# Patient Record
Sex: Female | Born: 1965 | Race: White | Hispanic: No | Smoking: Never smoker
Health system: Southern US, Community
[De-identification: ages and names within clinical notes are randomized; demographics above are authoritative.]

## PROBLEM LIST (undated history)

## (undated) DIAGNOSIS — Z803 Family history of malignant neoplasm of breast: Secondary | ICD-10-CM

## (undated) DIAGNOSIS — E785 Hyperlipidemia, unspecified: Secondary | ICD-10-CM

## (undated) DIAGNOSIS — Z801 Family history of malignant neoplasm of trachea, bronchus and lung: Secondary | ICD-10-CM

## (undated) DIAGNOSIS — S42302A Unspecified fracture of shaft of humerus, left arm, initial encounter for closed fracture: Secondary | ICD-10-CM

## (undated) DIAGNOSIS — Z8481 Family history of carrier of genetic disease: Secondary | ICD-10-CM

## (undated) DIAGNOSIS — E119 Type 2 diabetes mellitus without complications: Secondary | ICD-10-CM

## (undated) DIAGNOSIS — K069 Disorder of gingiva and edentulous alveolar ridge, unspecified: Secondary | ICD-10-CM

## (undated) DIAGNOSIS — M259 Joint disorder, unspecified: Secondary | ICD-10-CM

## (undated) DIAGNOSIS — E559 Vitamin D deficiency, unspecified: Secondary | ICD-10-CM

## (undated) DIAGNOSIS — G709 Myoneural disorder, unspecified: Secondary | ICD-10-CM

## (undated) DIAGNOSIS — D649 Anemia, unspecified: Secondary | ICD-10-CM

## (undated) DIAGNOSIS — Z800 Family history of malignant neoplasm of digestive organs: Secondary | ICD-10-CM

## (undated) DIAGNOSIS — M7500 Adhesive capsulitis of unspecified shoulder: Secondary | ICD-10-CM

## (undated) HISTORY — DX: Family history of carrier of genetic disease: Z84.81

## (undated) HISTORY — DX: Type 2 diabetes mellitus without complications: E11.9

## (undated) HISTORY — DX: Unspecified fracture of shaft of humerus, left arm, initial encounter for closed fracture: S42.302A

## (undated) HISTORY — DX: Family history of malignant neoplasm of breast: Z80.3

## (undated) HISTORY — PX: FRACTURE SURGERY: SHX138

## (undated) HISTORY — DX: Family history of malignant neoplasm of trachea, bronchus and lung: Z80.1

## (undated) HISTORY — DX: Vitamin D deficiency, unspecified: E55.9

## (undated) HISTORY — DX: Disorder of gingiva and edentulous alveolar ridge, unspecified: K06.9

## (undated) HISTORY — DX: Family history of malignant neoplasm of digestive organs: Z80.0

## (undated) HISTORY — DX: Hyperlipidemia, unspecified: E78.5

## (undated) HISTORY — DX: Adhesive capsulitis of unspecified shoulder: M75.00

---
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
## 1996-06-04 HISTORY — PX: CHOLECYSTECTOMY: SHX55

## 2009-10-18 ENCOUNTER — Ambulatory Visit: Payer: Self-pay | Admitting: Internal Medicine

## 2010-06-13 ENCOUNTER — Other Ambulatory Visit: Payer: Self-pay | Admitting: Internal Medicine

## 2010-06-19 ENCOUNTER — Ambulatory Visit: Payer: Self-pay | Admitting: Internal Medicine

## 2010-06-27 ENCOUNTER — Ambulatory Visit: Payer: Self-pay | Admitting: Internal Medicine

## 2010-07-05 ENCOUNTER — Ambulatory Visit: Payer: Self-pay | Admitting: Internal Medicine

## 2010-08-03 ENCOUNTER — Ambulatory Visit: Payer: Self-pay | Admitting: Internal Medicine

## 2010-09-03 ENCOUNTER — Ambulatory Visit: Payer: Self-pay | Admitting: Internal Medicine

## 2011-06-24 ENCOUNTER — Ambulatory Visit: Payer: Self-pay

## 2012-12-28 LAB — HM DIABETES EYE EXAM

## 2012-12-28 LAB — HM PAP SMEAR: HM Pap smear: NORMAL

## 2012-12-28 LAB — HM MAMMOGRAPHY: HM Mammogram: NORMAL

## 2014-06-04 DIAGNOSIS — M7500 Adhesive capsulitis of unspecified shoulder: Secondary | ICD-10-CM

## 2014-06-04 DIAGNOSIS — G709 Myoneural disorder, unspecified: Secondary | ICD-10-CM

## 2014-06-04 HISTORY — DX: Myoneural disorder, unspecified: G70.9

## 2014-06-04 HISTORY — DX: Adhesive capsulitis of unspecified shoulder: M75.00

## 2014-06-28 ENCOUNTER — Ambulatory Visit: Payer: Self-pay | Admitting: Physician Assistant

## 2014-07-05 HISTORY — PX: EXTERNAL FIXATION ARM: SHX1552

## 2014-07-23 DIAGNOSIS — S42409A Unspecified fracture of lower end of unspecified humerus, initial encounter for closed fracture: Secondary | ICD-10-CM

## 2014-07-23 HISTORY — DX: Unspecified fracture of lower end of unspecified humerus, initial encounter for closed fracture: S42.409A

## 2014-11-26 ENCOUNTER — Ambulatory Visit: Payer: Self-pay | Admitting: Internal Medicine

## 2014-12-29 ENCOUNTER — Encounter (INDEPENDENT_AMBULATORY_CARE_PROVIDER_SITE_OTHER): Payer: Self-pay

## 2014-12-29 ENCOUNTER — Ambulatory Visit (INDEPENDENT_AMBULATORY_CARE_PROVIDER_SITE_OTHER): Payer: Federal, State, Local not specified - PPO | Admitting: Internal Medicine

## 2014-12-29 ENCOUNTER — Encounter: Payer: Self-pay | Admitting: Internal Medicine

## 2014-12-29 VITALS — BP 137/86 | HR 78 | Temp 98.1°F | Ht 63.5 in | Wt 167.5 lb

## 2014-12-29 DIAGNOSIS — E119 Type 2 diabetes mellitus without complications: Secondary | ICD-10-CM | POA: Diagnosis not present

## 2014-12-29 DIAGNOSIS — G894 Chronic pain syndrome: Secondary | ICD-10-CM

## 2014-12-29 DIAGNOSIS — Z1239 Encounter for other screening for malignant neoplasm of breast: Secondary | ICD-10-CM | POA: Diagnosis not present

## 2014-12-29 DIAGNOSIS — Z0000 Encounter for general adult medical examination without abnormal findings: Secondary | ICD-10-CM | POA: Diagnosis not present

## 2014-12-29 DIAGNOSIS — R7989 Other specified abnormal findings of blood chemistry: Secondary | ICD-10-CM | POA: Diagnosis not present

## 2014-12-29 LAB — COMPREHENSIVE METABOLIC PANEL
ALK PHOS: 89 U/L (ref 39–117)
ALT: 28 U/L (ref 0–35)
AST: 20 U/L (ref 0–37)
Albumin: 4.2 g/dL (ref 3.5–5.2)
BUN: 7 mg/dL (ref 6–23)
CO2: 24 meq/L (ref 19–32)
CREATININE: 0.67 mg/dL (ref 0.40–1.20)
Calcium: 9.4 mg/dL (ref 8.4–10.5)
Chloride: 98 mEq/L (ref 96–112)
GFR: 100.27 mL/min (ref >60.00)
Glucose, Bld: 346 mg/dL — ABNORMAL HIGH (ref 70–99)
Potassium: 4 mEq/L (ref 3.5–5.1)
Sodium: 133 mEq/L — ABNORMAL LOW (ref 135–145)
Total Bilirubin: 0.5 mg/dL (ref 0.2–1.2)
Total Protein: 7.5 g/dL (ref 6.0–8.3)

## 2014-12-29 LAB — LIPID PANEL
Cholesterol: 296 mg/dL — ABNORMAL HIGH (ref 0–200)
HDL: 47.4 mg/dL (ref >39.00)
NONHDL: 248.6
TRIGLYCERIDES: 251 mg/dL — AB (ref 0.0–149.0)
Total CHOL/HDL Ratio: 6
VLDL: 50.2 mg/dL — ABNORMAL HIGH (ref 0.0–40.0)

## 2014-12-29 LAB — CBC WITH DIFFERENTIAL/PLATELET
Basophils Absolute: 0 10*3/uL (ref 0.0–0.1)
Basophils Relative: 0.5 % (ref 0.0–3.0)
EOS ABS: 0 10*3/uL (ref 0.0–0.7)
Eosinophils Relative: 0.5 % (ref 0.0–5.0)
HEMATOCRIT: 43.4 % (ref 36.0–46.0)
HEMOGLOBIN: 14.9 g/dL (ref 12.0–15.0)
Lymphocytes Relative: 36.8 % (ref 12.0–46.0)
Lymphs Abs: 2.8 10*3/uL (ref 0.7–4.0)
MCHC: 34.4 g/dL (ref 30.0–36.0)
MCV: 83.2 fl (ref 78.0–100.0)
Monocytes Absolute: 0.5 10*3/uL (ref 0.1–1.0)
Monocytes Relative: 7 % (ref 3.0–12.0)
NEUTROS ABS: 4.2 10*3/uL (ref 1.4–7.7)
Neutrophils Relative %: 55.2 % (ref 43.0–77.0)
PLATELETS: 259 10*3/uL (ref 150.0–400.0)
RBC: 5.21 Mil/uL — AB (ref 3.87–5.11)
RDW: 13.1 % (ref 11.5–15.5)
WBC: 7.7 10*3/uL (ref 4.0–10.5)

## 2014-12-29 LAB — LDL CHOLESTEROL, DIRECT: LDL DIRECT: 204 mg/dL

## 2014-12-29 LAB — TSH: TSH: 2.42 u[IU]/mL (ref 0.35–4.50)

## 2014-12-29 LAB — HEMOGLOBIN A1C: HEMOGLOBIN A1C: 11.6 % — AB (ref 4.6–6.5)

## 2014-12-29 LAB — MICROALBUMIN / CREATININE URINE RATIO
CREATININE, U: 72.8 mg/dL
MICROALB UR: 3 mg/dL — AB (ref 0.0–1.9)
Microalb Creat Ratio: 4.1 mg/g (ref 0.0–30.0)

## 2014-12-29 LAB — VITAMIN D 25 HYDROXY (VIT D DEFICIENCY, FRACTURES): VITD: 22.43 ng/mL — ABNORMAL LOW (ref 30.00–100.00)

## 2014-12-29 MED ORDER — GABAPENTIN 100 MG PO CAPS: 100.0000 mg | ORAL_CAPSULE | Freq: Three times a day (TID) | ORAL | Status: DC

## 2014-12-29 NOTE — Assessment & Plan Note (Signed)
Severe chronic pain after injury earlier this year. Reviewed [facility] ortho notes. Referral to pain management pending. Will start Neurontin 100mg  three times daily. Discussed potential side effects of this medication.

## 2014-12-29 NOTE — Assessment & Plan Note (Signed)
BG elevated. Will check A1c with labs. Discussed that we will need to add medication, but will make final decision based on labs today. Glucometer given today. Encouraged her to check BG 1-2 times daily at minimum.

## 2014-12-29 NOTE — Patient Instructions (Signed)
Start Neurontin 100mg  three times daily to help with pain.  Labs today.  Follow up in 2 weeks.

## 2014-12-29 NOTE — Progress Notes (Signed)
Pre visit review using our clinic review tool, if applicable. No additional management support is needed unless otherwise documented below in the visit note. 

## 2014-12-29 NOTE — Progress Notes (Signed)
Subjective:    Patient ID: Paula Mcmahon, female    DOB: 1965/06/14, 49 y.o.   MRN: [id number]  HPI  49YO female presents to establish care.  DM - BG have been near 200-400s. Not on any medication for this.  Notes some polyphagia.  Accident 06/28/2014, slipped on ice. Had multiple left arm fractures requiring surgical repair. 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  Chronic pain in left arm after this accident - Having burning pain in left hand. Unbearable at times. Continues with PT and OT at [facility]. Referral to pain management is pending.  Past medical, surgical, family and social history per today's encounter.  Review of Systems  Constitutional: Negative for fever, chills, appetite change, fatigue and unexpected weight change.  Eyes: Negative for visual disturbance.  Respiratory: Negative for shortness of breath.   Cardiovascular: Negative for chest pain, palpitations and leg swelling.  Gastrointestinal: Negative for nausea, vomiting, abdominal pain, diarrhea and constipation.  Musculoskeletal: Positive for myalgias and arthralgias.  Skin: Negative for color change and rash.  Neurological: Positive for weakness. Negative for numbness.  Hematological: Negative for adenopathy. Does not bruise/bleed easily.  Psychiatric/Behavioral: Positive for sleep disturbance. Negative for suicidal ideas and dysphoric mood. The patient is not nervous/anxious.        Objective:    BP 137/86 mmHg  Pulse 78  Temp(Src) 98.1 F (36.7 C) (Oral)  Ht 5' 3.5" (1.613 m)  Wt 167 lb 8 oz (75.978 kg)  BMI 29.20 kg/m2  SpO2 100%  LMP 12/20/2014 Physical Exam  Constitutional: She is oriented to person, place, and time. She appears well-developed and well-nourished. No distress.  HENT:  Head: Normocephalic and atraumatic.  Right Ear: External ear normal.  Left Ear: External ear normal.  Nose: Nose normal.  Mouth/Throat: Oropharynx is clear and moist. No oropharyngeal exudate.  Eyes: Conjunctivae and EOM  are normal. Pupils are equal, round, and reactive to light. Right eye exhibits no discharge. Left eye exhibits no discharge. No scleral icterus.  Neck: Normal range of motion. Neck supple. No tracheal deviation present. No thyromegaly present.  Cardiovascular: Normal rate, regular rhythm, normal heart sounds and intact distal pulses.  Exam reveals no gallop and no friction rub.   No murmur heard. Pulmonary/Chest: Effort normal and breath sounds normal. No respiratory distress. She has no wheezes. She has no rales. She exhibits no tenderness.  Abdominal: Soft. Bowel sounds are normal. She exhibits no distension and no mass. There is no tenderness. There is no rebound and no guarding.  Musculoskeletal: Normal range of motion. She exhibits no edema or tenderness.  Lymphadenopathy:    She has no cervical adenopathy.  Neurological: She is alert and oriented to person, place, and time. No cranial nerve deficit. She exhibits normal muscle tone. Coordination normal.  Skin: Skin is warm and dry. No rash noted. She is not diaphoretic. No erythema. No pallor.  Psychiatric: She has a normal mood and affect. Her behavior is normal. Judgment and thought content normal.          Assessment & Plan:   Problem List Items Addressed This Visit      Unprioritized   Chronic pain syndrome    Severe chronic pain after injury earlier this year. Reviewed [facility] ortho notes. Referral to pain management pending. Will start Neurontin 100mg  three times daily. Discussed potential side effects of this medication.      Relevant Medications   gabapentin (NEURONTIN) 100 MG capsule   Diabetes type 2, controlled - Primary  BG elevated. Will check A1c with labs. Discussed that we will need to add medication, but will make final decision based on labs today. Glucometer given today. Encouraged her to check BG 1-2 times daily at minimum.      Relevant Orders   Comprehensive metabolic panel   Hemoglobin A1c   Routine  general medical examination at a health care facility    Will check labs today. Plan for physical exam later this summer      Relevant Orders   CBC with Differential/Platelet   TSH   Microalbumin / creatinine urine ratio   Lipid panel   Vit D  25 hydroxy (rtn osteoporosis monitoring)   Screening for breast cancer    Mammogram ordered.      Relevant Orders   MM Digital Screening       Return in about 2 weeks (around 01/12/2015) for Recheck of Diabetes.

## 2014-12-29 NOTE — Assessment & Plan Note (Signed)
Will check labs today. Plan for physical exam later this summer

## 2014-12-29 NOTE — Assessment & Plan Note (Signed)
Mammogram ordered

## 2014-12-30 ENCOUNTER — Other Ambulatory Visit: Payer: Self-pay | Admitting: *Deleted

## 2014-12-30 MED ORDER — METFORMIN HCL 500 MG PO TABS: 500.0000 mg | ORAL_TABLET | Freq: Two times a day (BID) | ORAL | Status: DC

## 2014-12-30 MED ORDER — GLIPIZIDE 5 MG PO TABS: 2.5000 mg | ORAL_TABLET | Freq: Every day | ORAL | Status: DC

## 2014-12-30 MED ORDER — ATORVASTATIN CALCIUM 10 MG PO TABS: 10.0000 mg | ORAL_TABLET | Freq: Every day | ORAL | Status: DC

## 2014-12-31 ENCOUNTER — Encounter: Payer: Self-pay | Admitting: *Deleted

## 2015-01-07 ENCOUNTER — Encounter: Payer: Self-pay | Admitting: Internal Medicine

## 2015-01-10 ENCOUNTER — Ambulatory Visit: Payer: Federal, State, Local not specified - PPO

## 2015-01-12 ENCOUNTER — Encounter: Payer: Self-pay | Admitting: Internal Medicine

## 2015-01-12 ENCOUNTER — Ambulatory Visit (INDEPENDENT_AMBULATORY_CARE_PROVIDER_SITE_OTHER): Payer: Federal, State, Local not specified - PPO | Admitting: Internal Medicine

## 2015-01-12 VITALS — BP 122/62 | HR 72 | Temp 97.4°F | Wt 167.2 lb

## 2015-01-12 DIAGNOSIS — G894 Chronic pain syndrome: Secondary | ICD-10-CM | POA: Diagnosis not present

## 2015-01-12 DIAGNOSIS — E119 Type 2 diabetes mellitus without complications: Secondary | ICD-10-CM | POA: Diagnosis not present

## 2015-01-12 DIAGNOSIS — Z23 Encounter for immunization: Secondary | ICD-10-CM | POA: Diagnosis not present

## 2015-01-12 MED ORDER — GLIPIZIDE 5 MG PO TABS: 5.0000 mg | ORAL_TABLET | Freq: Every day | ORAL | Status: DC

## 2015-01-12 MED ORDER — SITAGLIPTIN PHOSPHATE 25 MG PO TABS: 25.0000 mg | ORAL_TABLET | Freq: Every day | ORAL | Status: DC

## 2015-01-12 MED ORDER — GABAPENTIN 300 MG PO CAPS: 300.0000 mg | ORAL_CAPSULE | Freq: Three times a day (TID) | ORAL | Status: DC

## 2015-01-12 NOTE — Assessment & Plan Note (Signed)
Chronic left hand pain. Will increase neurontin to 300mg  po tid. Discussed potential risks of this medication. Follow up by email in 1-2 weeks and visit in 4 weeks. Consider adding Cymbalta if no improvement.

## 2015-01-12 NOTE — Addendum Note (Signed)
Addended byCloyd Stagers B on: 01/12/2015 07:56 AM   Modules accepted: Orders

## 2015-01-12 NOTE — Assessment & Plan Note (Signed)
BG continue to be elevated. We discussed several options for escalating medication. Will add Januvia 25mg  daily. Continue Glipizide and Metformin. Email with BG in 1-2 weeks. Follow up in 4 weeks.

## 2015-01-12 NOTE — Patient Instructions (Addendum)
Start Januvia 25mg  daily with breakfast.  Continue Metformin and Glipizide.  Email with update in 1-2 weeks.  Increase Neurontin to 300mg  three times daily

## 2015-01-12 NOTE — Progress Notes (Signed)
Subjective:    Patient ID: Paula Mcmahon, female    DOB: December 07, 1965, 49 y.o.   MRN: [id number]  HPI  49YO female presents for follow up.  Last seen 7/27 to establish care. Started on neurontin for diabetic neuropathy. A1c elevated at 11.6%. Started back on Metformin and Glipizide.  BG initially running high, mostly in 200s. Dose of Glipizide increased to 5mg  daily. BG continue to be high, mostly near 200.  Started Neurontin for chronic hand pain. Some drowsiness with this medication. Pain very slightly improved. Planning to have left shoulder steroid injection with ortho this week.  Past medical, surgical, family and social history per today's encounter.   Review of Systems  Constitutional: Negative for fever, chills, appetite change, fatigue and unexpected weight change.  Eyes: Negative for visual disturbance.  Respiratory: Negative for shortness of breath.   Cardiovascular: Negative for chest pain and leg swelling.  Gastrointestinal: Negative for nausea, vomiting, abdominal pain, diarrhea and constipation.  Musculoskeletal: Positive for myalgias and arthralgias.  Skin: Negative for color change and rash.  Neurological: Positive for weakness and numbness.  Hematological: Negative for adenopathy. Does not bruise/bleed easily.  Psychiatric/Behavioral: Negative for sleep disturbance and dysphoric mood. The patient is not nervous/anxious.        Objective:    BP 122/62 mmHg  Pulse 72  Temp(Src) 97.4 F (36.3 C) (Oral)  Wt 167 lb 3.2 oz (75.841 kg)  SpO2 98%  LMP 01/07/2015 Physical Exam  Constitutional: She is oriented to person, place, and time. She appears well-developed and well-nourished. No distress.  HENT:  Head: Normocephalic and atraumatic.  Right Ear: External ear normal.  Left Ear: External ear normal.  Nose: Nose normal.  Mouth/Throat: Oropharynx is clear and moist.  Eyes: Conjunctivae are normal. Pupils are equal, round, and reactive to light. Right eye  exhibits no discharge. Left eye exhibits no discharge. No scleral icterus.  Neck: Normal range of motion. Neck supple. No tracheal deviation present. No thyromegaly present.  Cardiovascular: Normal rate, regular rhythm, normal heart sounds and intact distal pulses.  Exam reveals no gallop and no friction rub.   No murmur heard. Pulmonary/Chest: Effort normal and breath sounds normal. No respiratory distress. She has no wheezes. She has no rales. She exhibits no tenderness.  Musculoskeletal: Normal range of motion. She exhibits no edema or tenderness.  Lymphadenopathy:    She has no cervical adenopathy.  Neurological: She is alert and oriented to person, place, and time. No cranial nerve deficit. She exhibits normal muscle tone. Coordination normal.  Skin: Skin is warm and dry. No rash noted. She is not diaphoretic. No erythema. No pallor.  Psychiatric: She has a normal mood and affect. Her behavior is normal. Judgment and thought content normal.          Assessment & Plan:   Problem List Items Addressed This Visit      Unprioritized   Chronic pain syndrome    Chronic left hand pain. Will increase neurontin to 300mg  po tid. Discussed potential risks of this medication. Follow up by email in 1-2 weeks and visit in 4 weeks. Consider adding Cymbalta if no improvement.      Relevant Medications   gabapentin (NEURONTIN) 300 MG capsule   Diabetes type 2, controlled - Primary    BG continue to be elevated. We discussed several options for escalating medication. Will add Januvia 25mg  daily. Continue Glipizide and Metformin. Email with BG in 1-2 weeks. Follow up in 4 weeks.  Relevant Medications   glipiZIDE (GLUCOTROL) 5 MG tablet   sitaGLIPtin (JANUVIA) 25 MG tablet       Return in about 4 weeks (around 02/09/2015) for Recheck of Diabetes.

## 2015-01-14 ENCOUNTER — Ambulatory Visit
Admission: RE | Admit: 2015-01-14 | Discharge: 2015-01-14 | Disposition: A | Discharge status: Home / Self Care | Payer: Federal, State, Local not specified - PPO | Source: Ambulatory Visit | Attending: Internal Medicine | Admitting: Internal Medicine

## 2015-01-14 DIAGNOSIS — Z1239 Encounter for other screening for malignant neoplasm of breast: Secondary | ICD-10-CM

## 2015-01-14 DIAGNOSIS — Z1231 Encounter for screening mammogram for malignant neoplasm of breast: Secondary | ICD-10-CM | POA: Insufficient documentation

## 2015-01-14 DIAGNOSIS — R921 Mammographic calcification found on diagnostic imaging of breast: Secondary | ICD-10-CM | POA: Insufficient documentation

## 2015-01-17 ENCOUNTER — Other Ambulatory Visit: Payer: Self-pay | Admitting: Internal Medicine

## 2015-01-17 DIAGNOSIS — R921 Mammographic calcification found on diagnostic imaging of breast: Secondary | ICD-10-CM

## 2015-01-17 DIAGNOSIS — R928 Other abnormal and inconclusive findings on diagnostic imaging of breast: Secondary | ICD-10-CM

## 2015-01-20 ENCOUNTER — Other Ambulatory Visit: Payer: Federal, State, Local not specified - PPO

## 2015-01-21 ENCOUNTER — Ambulatory Visit: Payer: Federal, State, Local not specified - PPO

## 2015-01-21 ENCOUNTER — Ambulatory Visit
Admission: RE | Admit: 2015-01-21 | Discharge: 2015-01-21 | Disposition: A | Discharge status: Home / Self Care | Payer: Federal, State, Local not specified - PPO | Source: Ambulatory Visit | Attending: Internal Medicine | Admitting: Internal Medicine

## 2015-01-21 ENCOUNTER — Other Ambulatory Visit: Payer: Self-pay | Admitting: Internal Medicine

## 2015-01-21 DIAGNOSIS — R928 Other abnormal and inconclusive findings on diagnostic imaging of breast: Secondary | ICD-10-CM

## 2015-01-21 DIAGNOSIS — R921 Mammographic calcification found on diagnostic imaging of breast: Secondary | ICD-10-CM | POA: Diagnosis not present

## 2015-01-25 ENCOUNTER — Ambulatory Visit (INDEPENDENT_AMBULATORY_CARE_PROVIDER_SITE_OTHER): Payer: Federal, State, Local not specified - PPO | Admitting: General Surgery

## 2015-01-25 ENCOUNTER — Encounter: Payer: Self-pay | Admitting: General Surgery

## 2015-01-25 VITALS — BP 130/82 | HR 84 | Resp 14 | Ht 63.0 in | Wt 168.0 lb

## 2015-01-25 DIAGNOSIS — R928 Other abnormal and inconclusive findings on diagnostic imaging of breast: Secondary | ICD-10-CM | POA: Diagnosis not present

## 2015-01-25 NOTE — Patient Instructions (Signed)
The patient is aware to call back for any questions or concerns.  

## 2015-01-25 NOTE — Progress Notes (Signed)
Patient ID: Paula Mcmahon, female   DOB: 12-Jan-1966, 49 y.o.   MRN: [id number]  Chief Complaint  Patient presents with  . Other    Left Breast Mammogram    HPI [name] is a 49 y.o. female here for evaluation of abnormal mammogram done on 01/21/15.  The most recent mammogram was done on .  Patient does perform regular self breast checks and gets regular mammograms done.   She states she can not feel anything different in the breast. Denies any breast pain. Previous mammogram was 2012.    HPI  Past Medical History  Diagnosis Date  . Diabetes mellitus without complication   . Hyperlipidemia   . Frozen shoulder     Left  . Fracture of arm, left, multiple, closed   . Vitamin D deficiency   . Gum disease     Past Surgical History  Procedure Laterality Date  . External fixation arm  07/2014    left arm   . Irrigation and debridement sebaceous cyst  1988    Right Shoulder  . Cholecystectomy  1998    Family History  Problem Relation Age of Onset  . Cancer Maternal Grandmother   . Diabetes Maternal Grandmother   . Breast cancer Maternal Grandmother 79  . Hyperlipidemia Father   . Diabetes Sister   . Diabetes Brother     Juvenile diabetes    Social History Social History  Substance Use Topics  . Smoking status: Never Smoker   . Smokeless tobacco: None  . Alcohol Use: No    No Known Allergies  Current Outpatient Prescriptions  Medication Sig Dispense Refill  . atorvastatin (LIPITOR) 10 MG tablet Take 1 tablet (10 mg total) by mouth daily. 30 tablet 3  . gabapentin (NEURONTIN) 100 MG capsule Take 1 capsule by mouth 2 (two) times daily.  3  . gabapentin (NEURONTIN) 300 MG capsule Take 1 capsule (300 mg total) by mouth 3 (three) times daily. (Patient taking differently: Take 300 mg by mouth at bedtime. ) 90 capsule 3  . glipiZIDE (GLUCOTROL) 5 MG tablet Take 1 tablet (5 mg total) by mouth daily before breakfast. 30 tablet 3  . metFORMIN (GLUCOPHAGE) 500 MG  tablet Take 1 tablet (500 mg total) by mouth 2 (two) times daily. 60 tablet 3  . sitaGLIPtin (JANUVIA) 25 MG tablet Take 1 tablet (25 mg total) by mouth daily. 30 tablet 3   No current facility-administered medications for this visit.    Review of Systems Review of Systems  Blood pressure 130/82, pulse 84, resp. rate 14, height 5\' 3"  (1.6 m), weight 168 lb (76.204 kg), last menstrual period 01/07/2015.  Physical Exam Physical Exam  Constitutional: She is oriented to person, place, and time. She appears well-developed and well-nourished.  HENT:  Mouth/Throat: Oropharynx is clear and moist.  Eyes: Conjunctivae are normal. No scleral icterus.  Neck: Neck supple.  Cardiovascular: Normal rate, regular rhythm and normal heart sounds.   Pulmonary/Chest: Effort normal and breath sounds normal. Right breast exhibits no inverted nipple, no mass, no nipple discharge, no skin change and no tenderness. Left breast exhibits no inverted nipple, no mass, no nipple discharge, no skin change and no tenderness.  Musculoskeletal:  Limited movement left shoulder from previous surgery.  Lymphadenopathy:    She has no cervical adenopathy.    She has no axillary adenopathy.  Neurological: She is alert and oriented to person, place, and time.  Skin: Skin is warm and dry.  Psychiatric: Her  behavior is normal.    Data Reviewed Mammogram reviewed.  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 Radiologist has suggested a biopsy of the left breast and if benign then follow. Because of her left shoulder restrictions a stereotatic biopsy may not be feasible.    Plan    Maybe reasonable just to follow or consider a biopsy if the area is visible on ultrasound- will discuss with radiologist and additional opinions.     PCP:  Johnny Bridge 01/26/2015, 11:21 AM

## 2015-01-26 ENCOUNTER — Telehealth: Payer: Self-pay | Admitting: *Deleted

## 2015-01-26 ENCOUNTER — Encounter: Payer: Self-pay | Admitting: General Surgery

## 2015-01-26 NOTE — Telephone Encounter (Signed)
-----   Message from Christene Lye, MD sent at 01/26/2015 11:26 AM EDT ----- Please schedule a left breast US/core biopsy in office

## 2015-01-26 NOTE — Telephone Encounter (Signed)
Patient has been scheduled for office biopsy on Friday, 01-28-15 at 9 am.

## 2015-01-28 ENCOUNTER — Encounter: Payer: Self-pay | Admitting: General Surgery

## 2015-01-28 ENCOUNTER — Ambulatory Visit (INDEPENDENT_AMBULATORY_CARE_PROVIDER_SITE_OTHER): Payer: Federal, State, Local not specified - PPO | Admitting: General Surgery

## 2015-01-28 ENCOUNTER — Ambulatory Visit: Payer: Federal, State, Local not specified - PPO

## 2015-01-28 VITALS — BP 138/82 | HR 79 | Resp 14 | Ht 63.0 in | Wt 169.0 lb

## 2015-01-28 DIAGNOSIS — N63 Unspecified lump in breast: Secondary | ICD-10-CM

## 2015-01-28 DIAGNOSIS — N6320 Unspecified lump in the left breast, unspecified quadrant: Secondary | ICD-10-CM

## 2015-01-28 HISTORY — PX: BREAST BIOPSY: SHX20

## 2015-01-28 NOTE — Progress Notes (Signed)
Patient ID: Paula Mcmahon, female   DOB: 05/23/1966, 49 y.o.   MRN: [id number] Patient here for a scheduled left breast biopsy.  After further discussion US of the left breast was performed in 12-1 ocl location. At the 12.30 location above the areola there is an area of mixed echogenicity with some shadowing. This seems to correspond to the area of microcalcificatiions seen on mammogram Core biopsy of this area was performed and clip placed. Will get a left mammogram to see if clip is in area of mammographic microcalcifications. Pt advised fully.    PCP: Ronette Deter

## 2015-01-28 NOTE — Patient Instructions (Signed)
CARE AFTER BREAST BIOPSY  1. Leave the dressing on that your doctor applied after surgery. It is waterproof. You may bathe, shower and/or swim. The dressing will probably remain intact until your return office visit. If the dressing comes off, you will see small strips of tape against your skin on the incision. Do not remove these strips.  2. You may want to use a gauze,cloth or similar protection in your bra to prevent rubbing against your dressing and incision. This is not necessary, but you may feel more comfortable doing so.  3. It is recommended that you wear a bra day and night to give support to the breast. This will prevent the weight of the breast from pulling on the incision.  4. Your breast will feel hard and lumpy under the incision. Do not be alarmed. This is the underlying stitching of tissue. Softening of this tissue will occur in time.  5. Make sure you call the office and schedule an appointment in one week after your surgery. The office phone number is [phone]. The nurses at [facility] may have already done this for you.  6. You will notice about a week after your office visit that the strips of the tape on your incision will begin to loosen. These may then be removed.  7. Report to your doctor any of the following:  * Severe pain not relieved by your pain medication  *Redness of the incision  * Drainage from the incision  *Fever greater than 101 degrees   We will call you with the biopsy results

## 2015-02-02 ENCOUNTER — Other Ambulatory Visit: Payer: Self-pay | Admitting: *Deleted

## 2015-02-02 DIAGNOSIS — N6320 Unspecified lump in the left breast, unspecified quadrant: Secondary | ICD-10-CM

## 2015-02-02 NOTE — Progress Notes (Unsigned)
Patient notified of benign pathology results. She verbalizes understanding.   This patient has been scheduled for a unilateral left breast diagnostic mammogram at [facility] for 02-04-15 at 2:20 pm. This patient is aware of date, time, and instructions.

## 2015-02-04 ENCOUNTER — Ambulatory Visit: Payer: Federal, State, Local not specified - PPO

## 2015-02-04 ENCOUNTER — Other Ambulatory Visit: Payer: Federal, State, Local not specified - PPO

## 2015-02-10 ENCOUNTER — Ambulatory Visit
Admission: RE | Admit: 2015-02-10 | Discharge: 2015-02-10 | Disposition: A | Discharge status: Home / Self Care | Payer: Federal, State, Local not specified - PPO | Source: Ambulatory Visit | Attending: General Surgery | Admitting: General Surgery

## 2015-02-10 ENCOUNTER — Other Ambulatory Visit: Payer: Self-pay | Admitting: General Surgery

## 2015-02-10 DIAGNOSIS — N63 Unspecified lump in breast: Secondary | ICD-10-CM | POA: Insufficient documentation

## 2015-02-10 DIAGNOSIS — N6320 Unspecified lump in the left breast, unspecified quadrant: Secondary | ICD-10-CM

## 2015-02-17 ENCOUNTER — Telehealth: Payer: Self-pay | Admitting: *Deleted

## 2015-02-17 NOTE — Telephone Encounter (Signed)
Notified patient of benign pathology results, patient pleased. She had her repeat mammogram on 02-10-15, the the biopsy spot match the mammogram area of concern? When will we need to see her back?

## 2015-02-18 DIAGNOSIS — R52 Pain, unspecified: Secondary | ICD-10-CM

## 2015-02-18 DIAGNOSIS — M792 Neuralgia and neuritis, unspecified: Secondary | ICD-10-CM | POA: Insufficient documentation

## 2015-02-18 HISTORY — DX: Pain, unspecified: R52

## 2015-02-23 ENCOUNTER — Encounter: Payer: Self-pay | Admitting: General Surgery

## 2015-02-23 ENCOUNTER — Ambulatory Visit (INDEPENDENT_AMBULATORY_CARE_PROVIDER_SITE_OTHER): Payer: Federal, State, Local not specified - PPO | Admitting: General Surgery

## 2015-02-23 DIAGNOSIS — R920 Mammographic microcalcification found on diagnostic imaging of breast: Secondary | ICD-10-CM

## 2015-02-23 NOTE — Progress Notes (Signed)
Patient ID: Paula Mcmahon, female   DOB: 02-12-1966, 49 y.o.   MRN: [id number]  Chief Complaint  Patient presents with  . Follow-up    mammogram    HPI Paula Mcmahon is a 49 y.o. female here today following up from her mammogram with was done on 02/10/15 and here to discuss options.  HPI  Past Medical History  Diagnosis Date  . Diabetes mellitus without complication   . Hyperlipidemia   . Frozen shoulder     Left  . Fracture of arm, left, multiple, closed   . Vitamin D deficiency   . Gum disease     Past Surgical History  Procedure Laterality Date  . External fixation arm  07/2014    left arm   . Irrigation and debridement sebaceous cyst  1988    Right Shoulder  . Cholecystectomy  1998  . Breast biopsy Left 01/28/2015    negative    Family History  Problem Relation Age of Onset  . Cancer Maternal Grandmother   . Diabetes Maternal Grandmother   . Breast cancer Maternal Grandmother 79  . Hyperlipidemia Father   . Diabetes Sister   . Diabetes Brother     Juvenile diabetes    Social History Social History  Substance Use Topics  . Smoking status: Never Smoker   . Smokeless tobacco: None  . Alcohol Use: No    No Known Allergies  Current Outpatient Prescriptions  Medication Sig Dispense Refill  . atorvastatin (LIPITOR) 10 MG tablet Take 1 tablet (10 mg total) by mouth daily. 30 tablet 3  . gabapentin (NEURONTIN) 100 MG capsule Take 1 capsule by mouth 2 (two) times daily.  3  . gabapentin (NEURONTIN) 300 MG capsule Take 1 capsule (300 mg total) by mouth 3 (three) times daily. (Patient taking differently: Take 300 mg by mouth daily. ) 90 capsule 3  . glipiZIDE (GLUCOTROL) 5 MG tablet Take 1 tablet (5 mg total) by mouth daily before breakfast. 30 tablet 3  . metFORMIN (GLUCOPHAGE) 500 MG tablet Take 1 tablet (500 mg total) by mouth 2 (two) times daily. 60 tablet 3  . sitaGLIPtin (JANUVIA) 25 MG tablet Take 1 tablet (25 mg total) by mouth daily. 30 tablet 3  .  topiramate (TOPAMAX) 25 MG capsule Take 25 mg by mouth 2 (two) times daily.     No current facility-administered medications for this visit.    Review of Systems Review of Systems  Constitutional: Negative.   Respiratory: Negative.   Cardiovascular: Negative.     Blood pressure 122/72, pulse 76, resp. rate 14, height 5\' 3"  (1.6 m), weight 169 lb (76.658 kg), last menstrual period 01/30/2015.  Physical Exam Physical Exam  Constitutional: She is oriented to person, place, and time. She appears well-developed and well-nourished.  Eyes: Conjunctivae are normal. No scleral icterus.  Neck: Neck supple.  Cardiovascular: Normal rate, regular rhythm and normal heart sounds.   Pulmonary/Chest: Effort normal and breath sounds normal. Right breast exhibits no inverted nipple, no mass, no nipple discharge, no skin change and no tenderness. Left breast exhibits no inverted nipple, no nipple discharge, no skin change and no tenderness.  Lymphadenopathy:    She has no cervical adenopathy.    She has no axillary adenopathy.  Neurological: She is alert and oriented to person, place, and time.  Skin: Skin is warm and dry.  She has marked limitation of left shoulder movement, and she does not feel she can lay prone for more than a  few minutes.  Data Reviewed Mammogram reviewed. Biopsied area in left breast is more anterior about 4cm from the area of microcalcifications  Assessment    Left breast microcalcifications that warrant biopsy    Plan    Discussed excision of the area of concern with wire localization. She is agreeable.    Patient to be scheduled for a left breat biopsy and excision .  This patient's surgery has been scheduled for 03-10-15 at [facility].   PCP:  Fulton Reek 02/24/2015, 3:31 PM

## 2015-02-23 NOTE — Patient Instructions (Addendum)
Patient to have a left breast biopsy an excision.   This patient's surgery has been scheduled for 03-10-15 at [facility].

## 2015-02-24 ENCOUNTER — Other Ambulatory Visit: Payer: Self-pay | Admitting: General Surgery

## 2015-02-24 ENCOUNTER — Encounter: Payer: Self-pay | Admitting: General Surgery

## 2015-02-24 DIAGNOSIS — C50912 Malignant neoplasm of unspecified site of left female breast: Secondary | ICD-10-CM

## 2015-02-28 ENCOUNTER — Ambulatory Visit: Admission: RE | Admit: 2015-02-28 | Payer: Federal, State, Local not specified - PPO | Source: Ambulatory Visit

## 2015-02-28 ENCOUNTER — Ambulatory Visit: Payer: Federal, State, Local not specified - PPO

## 2015-03-03 ENCOUNTER — Inpatient Hospital Stay
Admission: RE | Admit: 2015-03-03 | Discharge: 2015-03-03 | Disposition: A | Discharge status: Home / Self Care | Payer: Federal, State, Local not specified - PPO | Source: Ambulatory Visit

## 2015-03-03 HISTORY — DX: Joint disorder, unspecified: M25.9

## 2015-03-07 ENCOUNTER — Encounter: Payer: Self-pay | Admitting: *Deleted

## 2015-03-07 NOTE — Patient Instructions (Signed)
  Your procedure is scheduled on: 03-10-15 Report to [place] @ 8:15 PER PT   Remember: Instructions that are not followed completely may result in serious medical risk, up to and including death, or upon the discretion of your surgeon and anesthesiologist your surgery may need to be rescheduled.    _X___ 1. Do not eat food or drink liquids after midnight. No gum chewing or hard candies.     _X___ 2. No Alcohol for 24 hours before or after surgery.   ____ 3. Bring all medications with you on the day of surgery if instructed.    __X__ 4. Notify your doctor if there is any change in your medical condition     (cold, fever, infections).     Do not wear jewelry, make-up, hairpins, clips or nail polish.  Do not wear lotions, powders, or perfumes. You may wear deodorant.  Do not shave 48 hours prior to surgery. Men may shave face and neck.  Do not bring valuables to the hospital.    [facility] is not responsible for any belongings or valuables.               Contacts, dentures or bridgework may not be worn into surgery.  Leave your suitcase in the car. After surgery it may be brought to your room.  For patients admitted to the hospital, discharge time is determined by your  treatment team.   Patients discharged the day of surgery will not be allowed to drive home.   Please read over the following fact sheets that you were given:      _X___ Take these medicines the morning of surgery with A SIP OF WATER:    1. LIPITOR  2. TOPAMAX  3.   4.  5.  6.  ____ Fleet Enema (as directed)   _X___ Use CHG Soap as directed  ____ Use inhalers on the day of surgery  _X___ Stop metformin 2 days prior to surgery-LAST DOSE 03-07-15 (MONDAY)    ____ Take 1/2 of usual insulin dose the night before surgery and none on the morning of surgery.   ____ Stop Coumadin/Plavix/aspirin-N/A  ____ Stop Anti-inflammatories-NO NSAIDS OR ASPIRIN PRODUCTS-TYLENOL OK   ____ Stop supplements  until after surgery.    ____ Bring C-Pap to the hospital.

## 2015-03-08 ENCOUNTER — Other Ambulatory Visit: Payer: Self-pay | Admitting: General Surgery

## 2015-03-08 DIAGNOSIS — C50912 Malignant neoplasm of unspecified site of left female breast: Secondary | ICD-10-CM

## 2015-03-09 ENCOUNTER — Encounter
Admission: RE | Admit: 2015-03-09 | Discharge: 2015-03-09 | Disposition: A | Discharge status: Home / Self Care | Payer: Federal, State, Local not specified - PPO | Source: Ambulatory Visit | Attending: Anesthesiology | Admitting: Anesthesiology

## 2015-03-09 DIAGNOSIS — M7502 Adhesive capsulitis of left shoulder: Secondary | ICD-10-CM | POA: Diagnosis not present

## 2015-03-09 DIAGNOSIS — R920 Mammographic microcalcification found on diagnostic imaging of breast: Secondary | ICD-10-CM | POA: Diagnosis present

## 2015-03-09 DIAGNOSIS — E559 Vitamin D deficiency, unspecified: Secondary | ICD-10-CM | POA: Diagnosis not present

## 2015-03-09 DIAGNOSIS — M1990 Unspecified osteoarthritis, unspecified site: Secondary | ICD-10-CM | POA: Diagnosis not present

## 2015-03-09 DIAGNOSIS — Z79899 Other long term (current) drug therapy: Secondary | ICD-10-CM | POA: Diagnosis not present

## 2015-03-09 DIAGNOSIS — E785 Hyperlipidemia, unspecified: Secondary | ICD-10-CM | POA: Diagnosis not present

## 2015-03-09 DIAGNOSIS — Z833 Family history of diabetes mellitus: Secondary | ICD-10-CM | POA: Diagnosis not present

## 2015-03-09 DIAGNOSIS — Z8249 Family history of ischemic heart disease and other diseases of the circulatory system: Secondary | ICD-10-CM | POA: Diagnosis not present

## 2015-03-09 DIAGNOSIS — Z803 Family history of malignant neoplasm of breast: Secondary | ICD-10-CM | POA: Diagnosis not present

## 2015-03-09 DIAGNOSIS — E119 Type 2 diabetes mellitus without complications: Secondary | ICD-10-CM | POA: Diagnosis not present

## 2015-03-09 DIAGNOSIS — K069 Disorder of gingiva and edentulous alveolar ridge, unspecified: Secondary | ICD-10-CM | POA: Diagnosis not present

## 2015-03-09 DIAGNOSIS — Z9049 Acquired absence of other specified parts of digestive tract: Secondary | ICD-10-CM | POA: Diagnosis not present

## 2015-03-09 LAB — BASIC METABOLIC PANEL
Anion gap: 8 (ref 5–15)
BUN: 7 mg/dL (ref 6–20)
CHLORIDE: 107 mmol/L (ref 101–111)
CO2: 21 mmol/L — AB (ref 22–32)
CREATININE: 0.65 mg/dL (ref 0.44–1.00)
Calcium: 9.3 mg/dL (ref 8.9–10.3)
GFR calc Af Amer: >60 mL/min (ref >60)
GFR calc non Af Amer: >60 mL/min (ref >60)
Glucose, Bld: 181 mg/dL — ABNORMAL HIGH (ref 65–99)
POTASSIUM: 4.1 mmol/L (ref 3.5–5.1)
SODIUM: 136 mmol/L (ref 135–145)

## 2015-03-10 ENCOUNTER — Encounter: Payer: Self-pay | Admitting: *Deleted

## 2015-03-10 ENCOUNTER — Ambulatory Visit: Payer: Federal, State, Local not specified - PPO | Admitting: Anesthesiology

## 2015-03-10 ENCOUNTER — Ambulatory Visit
Admission: RE | Admit: 2015-03-10 | Discharge: 2015-03-10 | Disposition: A | Discharge status: Home / Self Care | Payer: Federal, State, Local not specified - PPO | Source: Ambulatory Visit | Attending: General Surgery | Admitting: General Surgery

## 2015-03-10 ENCOUNTER — Encounter
Admission: RE | Disposition: A | Discharge status: Home / Self Care | Payer: Self-pay | Source: Ambulatory Visit | Attending: General Surgery

## 2015-03-10 ENCOUNTER — Ambulatory Visit: Payer: Federal, State, Local not specified - PPO

## 2015-03-10 DIAGNOSIS — Z79899 Other long term (current) drug therapy: Secondary | ICD-10-CM | POA: Insufficient documentation

## 2015-03-10 DIAGNOSIS — R920 Mammographic microcalcification found on diagnostic imaging of breast: Secondary | ICD-10-CM

## 2015-03-10 DIAGNOSIS — Z9049 Acquired absence of other specified parts of digestive tract: Secondary | ICD-10-CM | POA: Insufficient documentation

## 2015-03-10 DIAGNOSIS — Z803 Family history of malignant neoplasm of breast: Secondary | ICD-10-CM | POA: Insufficient documentation

## 2015-03-10 DIAGNOSIS — Z8249 Family history of ischemic heart disease and other diseases of the circulatory system: Secondary | ICD-10-CM | POA: Insufficient documentation

## 2015-03-10 DIAGNOSIS — C50912 Malignant neoplasm of unspecified site of left female breast: Secondary | ICD-10-CM

## 2015-03-10 DIAGNOSIS — M7502 Adhesive capsulitis of left shoulder: Secondary | ICD-10-CM | POA: Insufficient documentation

## 2015-03-10 DIAGNOSIS — Z833 Family history of diabetes mellitus: Secondary | ICD-10-CM | POA: Insufficient documentation

## 2015-03-10 DIAGNOSIS — E119 Type 2 diabetes mellitus without complications: Secondary | ICD-10-CM | POA: Insufficient documentation

## 2015-03-10 DIAGNOSIS — K069 Disorder of gingiva and edentulous alveolar ridge, unspecified: Secondary | ICD-10-CM | POA: Insufficient documentation

## 2015-03-10 DIAGNOSIS — E559 Vitamin D deficiency, unspecified: Secondary | ICD-10-CM | POA: Insufficient documentation

## 2015-03-10 DIAGNOSIS — E785 Hyperlipidemia, unspecified: Secondary | ICD-10-CM | POA: Insufficient documentation

## 2015-03-10 DIAGNOSIS — M1990 Unspecified osteoarthritis, unspecified site: Secondary | ICD-10-CM | POA: Insufficient documentation

## 2015-03-10 HISTORY — PX: BREAST BIOPSY: SHX20

## 2015-03-10 HISTORY — DX: Anemia, unspecified: D64.9

## 2015-03-10 HISTORY — DX: Myoneural disorder, unspecified: G70.9

## 2015-03-10 HISTORY — PX: BREAST EXCISIONAL BIOPSY: SUR124

## 2015-03-10 LAB — POCT PREGNANCY, URINE: PREG TEST UR: NEGATIVE

## 2015-03-10 LAB — GLUCOSE, CAPILLARY
GLUCOSE-CAPILLARY: 158 mg/dL — AB (ref 65–99)
Glucose-Capillary: 166 mg/dL — ABNORMAL HIGH (ref 65–99)

## 2015-03-10 SURGERY — BREAST BIOPSY WITH NEEDLE LOCALIZATION
Anesthesia: General | Laterality: Left

## 2015-03-10 MED ORDER — FENTANYL CITRATE (PF) 100 MCG/2ML IJ SOLN
INTRAMUSCULAR | Status: DC | PRN
  Administered 2015-03-10 (×2): 50 ug via INTRAVENOUS

## 2015-03-10 MED ORDER — CHLORHEXIDINE GLUCONATE 4 % EX LIQD: 1.0000 "application " | Freq: Once | CUTANEOUS | Status: DC

## 2015-03-10 MED ORDER — OXYCODONE HCL 5 MG/5ML PO SOLN: 5.0000 mg | Freq: Once | ORAL | Status: AC | PRN

## 2015-03-10 MED ORDER — LIDOCAINE HCL 1 % IJ SOLN
INTRAMUSCULAR | Status: DC | PRN
  Administered 2015-03-10: 5 mL

## 2015-03-10 MED ORDER — ONDANSETRON HCL 4 MG/2ML IJ SOLN
INTRAMUSCULAR | Status: DC | PRN
  Administered 2015-03-10: 4 mg via INTRAVENOUS

## 2015-03-10 MED ORDER — FENTANYL CITRATE (PF) 100 MCG/2ML IJ SOLN: 25.0000 ug | INTRAMUSCULAR | Status: DC | PRN

## 2015-03-10 MED ORDER — OXYCODONE HCL 5 MG PO TABS
5.0000 mg | ORAL_TABLET | Freq: Once | ORAL | Status: AC | PRN
  Administered 2015-03-10: 5 mg via ORAL

## 2015-03-10 MED ORDER — ACETAMINOPHEN 10 MG/ML IV SOLN
INTRAVENOUS | Status: DC | PRN
  Administered 2015-03-10: 1000 mg via INTRAVENOUS

## 2015-03-10 MED ORDER — BUPIVACAINE HCL (PF) 0.5 % IJ SOLN
INTRAMUSCULAR | Status: AC
  Filled 2015-03-10: qty 30

## 2015-03-10 MED ORDER — FAMOTIDINE 20 MG PO TABS
ORAL_TABLET | ORAL | Status: DC
Start: 2015-03-10 — End: 2015-03-10
  Filled 2015-03-10: qty 1

## 2015-03-10 MED ORDER — BUPIVACAINE HCL 0.5 % IJ SOLN
INTRAMUSCULAR | Status: DC | PRN
  Administered 2015-03-10: 5 mL

## 2015-03-10 MED ORDER — MIDAZOLAM HCL 2 MG/2ML IJ SOLN
INTRAMUSCULAR | Status: DC | PRN
  Administered 2015-03-10: 2 mg via INTRAVENOUS

## 2015-03-10 MED ORDER — ACETAMINOPHEN 10 MG/ML IV SOLN
INTRAVENOUS | Status: AC
  Filled 2015-03-10: qty 100

## 2015-03-10 MED ORDER — SODIUM CHLORIDE 0.9 % IV SOLN
INTRAVENOUS | Status: DC
  Administered 2015-03-10: 10:00:00 via INTRAVENOUS

## 2015-03-10 MED ORDER — LIDOCAINE HCL (PF) 1 % IJ SOLN
INTRAMUSCULAR | Status: AC
  Filled 2015-03-10: qty 30

## 2015-03-10 MED ORDER — LIDOCAINE HCL (CARDIAC) 20 MG/ML IV SOLN
INTRAVENOUS | Status: DC | PRN
  Administered 2015-03-10: 100 mg via INTRAVENOUS

## 2015-03-10 MED ORDER — PROPOFOL 10 MG/ML IV BOLUS
INTRAVENOUS | Status: DC | PRN
  Administered 2015-03-10: 200 mg via INTRAVENOUS

## 2015-03-10 MED ORDER — TRAMADOL HCL 50 MG PO TABS: 50.0000 mg | ORAL_TABLET | Freq: Four times a day (QID) | ORAL | Status: DC | PRN

## 2015-03-10 MED ORDER — EPHEDRINE SULFATE 50 MG/ML IJ SOLN
INTRAMUSCULAR | Status: DC | PRN
  Administered 2015-03-10 (×2): 5 mg via INTRAVENOUS

## 2015-03-10 MED ORDER — FAMOTIDINE 20 MG PO TABS
20.0000 mg | ORAL_TABLET | Freq: Once | ORAL | Status: AC
  Administered 2015-03-10: 20 mg via ORAL

## 2015-03-10 MED ORDER — OXYCODONE HCL 5 MG PO TABS
ORAL_TABLET | ORAL | Status: AC
  Filled 2015-03-10: qty 1

## 2015-03-10 MED ORDER — DEXAMETHASONE SODIUM PHOSPHATE 4 MG/ML IJ SOLN
INTRAMUSCULAR | Status: DC | PRN
  Administered 2015-03-10: 5 mg via INTRAVENOUS

## 2015-03-10 SURGICAL SUPPLY — 31 items
BLADE SURG 15 STRL SS SAFETY (BLADE) ×2 IMPLANT
CANISTER SUCT 1200ML W/VALVE (MISCELLANEOUS) ×2 IMPLANT
CHLORAPREP W/TINT 26ML (MISCELLANEOUS) ×2 IMPLANT
COVER PROBE FLX POLY STRL (MISCELLANEOUS) ×2 IMPLANT
DEVICE DUBIN SPECIMEN MAMMOGRA (MISCELLANEOUS) ×2 IMPLANT
DEVICE LOCALIZATION ULTRAWIRE (WIRE) IMPLANT
DEVICE ULTRAWIRE LOCAL 19X9 (WIRE) IMPLANT
DRAPE LAPAROTOMY 100X77 ABD (DRAPES) ×2 IMPLANT
GLOVE BIO SURGEON STRL SZ7 (GLOVE) ×12 IMPLANT
GOWN STRL REUS W/ TWL LRG LVL3 (GOWN DISPOSABLE) ×3 IMPLANT
GOWN STRL REUS W/TWL LRG LVL3 (GOWN DISPOSABLE) ×3
KIT RM TURNOVER STRD PROC AR (KITS) ×2 IMPLANT
LABEL OR SOLS (LABEL) ×2 IMPLANT
LIQUID BAND (GAUZE/BANDAGES/DRESSINGS) ×2 IMPLANT
MARGIN MAP 10MM (MISCELLANEOUS) ×2 IMPLANT
NDL HPO THNWL 1X22GA REG BVL (NEEDLE) IMPLANT
NEEDLE HYPO 25X1 1.5 SAFETY (NEEDLE) ×2 IMPLANT
NEEDLE SAFETY 22GX1 (NEEDLE)
PACK BASIN MINOR ARMC (MISCELLANEOUS) ×2 IMPLANT
PAD GROUND ADULT SPLIT (MISCELLANEOUS) ×2 IMPLANT
SUT ETHILON 3-0 FS-10 30 BLK (SUTURE) ×2
SUT VIC AB 2-0 SH 27 (SUTURE) ×1
SUT VIC AB 2-0 SH 27XBRD (SUTURE) ×1 IMPLANT
SUT VIC AB 3-0 54X BRD REEL (SUTURE) ×1 IMPLANT
SUT VIC AB 3-0 BRD 54 (SUTURE) ×1
SUT VIC AB 4-0 FS2 27 (SUTURE) ×2 IMPLANT
SUTURE EHLN 3-0 FS-10 30 BLK (SUTURE) ×1 IMPLANT
SYR CONTROL 10ML (SYRINGE) ×2 IMPLANT
ULTRAWIRE LOCAL DEVICE 19X9 (WIRE)
ULTRAWIRE LOCALIZATION DEVICE (WIRE)
WATER STERILE IRR 1000ML POUR (IV SOLUTION) ×2 IMPLANT

## 2015-03-10 NOTE — Anesthesia Procedure Notes (Signed)
Procedure Name: LMA Insertion Date/Time: 03/10/2015 10:16 AM Performed by: Aline Brochure Pre-anesthesia Checklist: Patient identified, Emergency Drugs available, Suction available and Patient being monitored Patient Re-evaluated:Patient Re-evaluated prior to inductionOxygen Delivery Method: Circle system utilized Preoxygenation: Pre-oxygenation with 100% oxygen Intubation Type: IV induction Ventilation: Mask ventilation without difficulty LMA: LMA inserted LMA Size: 3.5 Number of attempts: 1 Airway Equipment and Method: Patient positioned with wedge pillow Placement Confirmation: positive ETCO2 and breath sounds checked- equal and bilateral Tube secured with: Tape Dental Injury: Teeth and Oropharynx as per pre-operative assessment

## 2015-03-10 NOTE — Op Note (Signed)
Preop diagnosis: Left breast microcalcifications  Post op diagnosis: Same  Operation: Left breast lumpectomy  Surgeon: S.G.Verlene Glantz  Assistant:     Anesthesia: Gen.  Complications: None  EBL: Minimal  Drains: None  Description: Patient underwent localization of a cluster of microcalcifications in the superior portion of the left breast with mammography. She was brought to the operating room and placed the supine position the operating table and general anesthesia was established with the use of an LMA. Left breast was prepped and draped as sterile field. Timeout was performed. The calcifications of interest appeared to be in the mid part of the thick part of the wire and the wire went right through the lesion is located at 11:00 position in the superior most aspect of left breast. Curvilinear incision was made below the level of the wire entrance and deepened through the subcutaneous tissue, wire was freed from this skin. Skin and subcutaneous tissue was elevated on both sides with adequate exposure. Using the wire as a guide a good core of tissue surrounding this and extending beyond the hooked end of the wire was excised out with cautery. Palpation did not reveal any defined mass in this area. Specimen mammogram showed that the area of interest was completely included in the specimen. The excised tissue was also tagged for margins in case there was then need for margin assessment. After ensuring hemostasis the deeper tissues were closed in 2 layers of 2-0 Vicryl. Skin closed with subcuticular 4-0 Vicryl covered with the liqui  ban. Patient subsequently was returned recovery room in stable condition

## 2015-03-10 NOTE — Interval H&P Note (Signed)
History and Physical Interval Note:  03/10/2015 9:09 AM  Paula Mcmahon  has presented today for surgery, with the diagnosis of MICROCALCIFICATIONS LEFT BREAST  The various methods of treatment have been discussed with the patient and family. After consideration of risks, benefits and other options for treatment, the patient has consented to  Procedure(s): BREAST BIOPSY WITH NEEDLE LOCALIZATION (Left) as a surgical intervention .  The patient's history has been reviewed, patient examined, no change in status, stable for surgery.  I have reviewed the patient's chart and labs.  Questions were answered to the patient's satisfaction.     Sheila Ocasio G

## 2015-03-10 NOTE — Discharge Instructions (Signed)
Breast Biopsy, Care After These instructions give you information on caring for yourself after your procedure. Your doctor may also give you more specific instructions. Call your doctor if you have any problems or questions after your procedure. HOME CARE  Only take medicine as told by your doctor.  Do not take aspirin.  Keep your sutures (stitches) dry when bathing.  Protect the biopsy area. Do not let the area get bumped.  Avoid activities that could pull the biopsy site open until your doctor approves. This includes:  Stretching.  Reaching.  Exercise.  Sports.  Lifting more than 3lb.  Continue your normal diet.  Wear a good support bra for as long as told by your doctor.  Change any bandages (dressings) as told by your doctor.  Do not drink alcohol while taking pain medicine.  Keep all doctor visits as told. Ask when your test results will be ready. Make sure you get your test results. GET HELP RIGHT AWAY IF:   You have a fever.  You have more bleeding (more than a small spot) from the biopsy site.  You have trouble breathing.  You have yellowish-white fluid (pus) coming from the biopsy site.  You have redness, puffiness (swelling), or more pain in the biopsy site.  You have a bad smell coming from the biopsy site.  Your biopsy site opens after sutures, staples, or sticky strips have been removed.  You have a rash.  You need stronger medicine. MAKE SURE YOU:  Understand these instructions.  Will watch your condition.  Will get help right away if you are not doing well or get worse.   This information is not intended to replace advice given to you by your health care provider. Make sure you discuss any questions you have with your health care provider.   Document Released: 03/17/2009 Document Revised: 06/11/2014 Document Reviewed: 07/01/2011 Elsevier Interactive Patient Education 2016 [place] Anesthesia, Adult, Care After Refer to this  sheet in the next few weeks. These instructions provide you with information on caring for yourself after your procedure. Your health care provider may also give you more specific instructions. Your treatment has been planned according to current medical practices, but problems sometimes occur. Call your health care provider if you have any problems or questions after your procedure. WHAT TO EXPECT AFTER THE PROCEDURE After the procedure, it is typical to experience:  Sleepiness.  Nausea and vomiting. HOME CARE INSTRUCTIONS  For the first 24 hours after general anesthesia:  Have a responsible person with you.  Do not drive a car. If you are alone, do not take public transportation.  Do not drink alcohol.  Do not take medicine that has not been prescribed by your health care provider.  Do not sign important papers or make important decisions.  You may resume a normal diet and activities as directed by your health care provider.  Change bandages (dressings) as directed.  If you have questions or problems that seem related to general anesthesia, call the hospital and ask for the anesthetist or anesthesiologist on call. SEEK MEDICAL CARE IF:  You have nausea and vomiting that continue the day after anesthesia.  You develop a rash. SEEK IMMEDIATE MEDICAL CARE IF:   You have difficulty breathing.  You have chest pain.  You have any allergic problems.   This information is not intended to replace advice given to you by your health care provider. Make sure you discuss any questions you have with your health care  provider.   Document Released: 08/27/2000 Document Revised: 06/11/2014 Document Reviewed: 09/19/2011 Elsevier Interactive Patient Education 2016 [place] Anesthesia, Adult, Care After Refer to this sheet in the next few weeks. These instructions provide you with information on caring for yourself after your procedure. Your health care provider may also give  you more specific instructions. Your treatment has been planned according to current medical practices, but problems sometimes occur. Call your health care provider if you have any problems or questions after your procedure. WHAT TO EXPECT AFTER THE PROCEDURE After the procedure, it is typical to experience: Sleepiness. Nausea and vomiting. HOME CARE INSTRUCTIONS For the first 24 hours after general anesthesia: Have a responsible person with you. Do not drive a car. If you are alone, do not take public transportation. Do not drink alcohol. Do not take medicine that has not been prescribed by your health care provider. Do not sign important papers or make important decisions. You may resume a normal diet and activities as directed by your health care provider. Change bandages (dressings) as directed. If you have questions or problems that seem related to general anesthesia, call the hospital and ask for the anesthetist or anesthesiologist on call. SEEK MEDICAL CARE IF: You have nausea and vomiting that continue the day after anesthesia. You develop a rash. SEEK IMMEDIATE MEDICAL CARE IF:  You have difficulty breathing. You have chest pain. You have any allergic problems.   This information is not intended to replace advice given to you by your health care provider. Make sure you discuss any questions you have with your health care provider.   Document Released: 08/27/2000 Document Revised: 06/11/2014 Document Reviewed: 09/19/2011 Elsevier Interactive Patient Education [employer].

## 2015-03-10 NOTE — H&P (View-Only) (Signed)
Patient ID: Paula Mcmahon, female   DOB: [birth year], 49 y.o.   MRN: [id number]  Chief Complaint  Patient presents with  . Follow-up    mammogram    HPI Paula Mcmahon is a 49 y.o. female here today following up from her mammogram with was done on 02/10/15 and here to discuss options.  HPI  Past Medical History  Diagnosis Date  . Diabetes mellitus without complication   . Hyperlipidemia   . Frozen shoulder     Left  . Fracture of arm, left, multiple, closed   . Vitamin D deficiency   . Gum disease     Past Surgical History  Procedure Laterality Date  . External fixation arm  07/2014    left arm   . Irrigation and debridement sebaceous cyst  1988    Right Shoulder  . Cholecystectomy  1998  . Breast biopsy Left 01/28/2015    negative    Family History  Problem Relation Age of Onset  . Cancer Maternal Grandmother   . Diabetes Maternal Grandmother   . Breast cancer Maternal Grandmother 79  . Hyperlipidemia Father   . Diabetes Sister   . Diabetes Brother     Juvenile diabetes    Social History Social History  Substance Use Topics  . Smoking status: Never Smoker   . Smokeless tobacco: None  . Alcohol Use: No    No Known Allergies  Current Outpatient Prescriptions  Medication Sig Dispense Refill  . atorvastatin (LIPITOR) 10 MG tablet Take 1 tablet (10 mg total) by mouth daily. 30 tablet 3  . gabapentin (NEURONTIN) 100 MG capsule Take 1 capsule by mouth 2 (two) times daily.  3  . gabapentin (NEURONTIN) 300 MG capsule Take 1 capsule (300 mg total) by mouth 3 (three) times daily. (Patient taking differently: Take 300 mg by mouth daily. ) 90 capsule 3  . glipiZIDE (GLUCOTROL) 5 MG tablet Take 1 tablet (5 mg total) by mouth daily before breakfast. 30 tablet 3  . metFORMIN (GLUCOPHAGE) 500 MG tablet Take 1 tablet (500 mg total) by mouth 2 (two) times daily. 60 tablet 3  . sitaGLIPtin (JANUVIA) 25 MG tablet Take 1 tablet (25 mg total) by mouth daily. 30 tablet 3  .  topiramate (TOPAMAX) 25 MG capsule Take 25 mg by mouth 2 (two) times daily.     No current facility-administered medications for this visit.    Review of Systems Review of Systems  Constitutional: Negative.   Respiratory: Negative.   Cardiovascular: Negative.     Blood pressure 122/72, pulse 76, resp. rate 14, height 5\' 3"  (1.6 m), weight 169 lb (76.658 kg), last menstrual period 01/30/2015.  Physical Exam Physical Exam  Constitutional: She is oriented to person, place, and time. She appears well-developed and well-nourished.  Eyes: Conjunctivae are normal. No scleral icterus.  Neck: Neck supple.  Cardiovascular: Normal rate, regular rhythm and normal heart sounds.   Pulmonary/Chest: Effort normal and breath sounds normal. Right breast exhibits no inverted nipple, no mass, no nipple discharge, no skin change and no tenderness. Left breast exhibits no inverted nipple, no nipple discharge, no skin change and no tenderness.  Lymphadenopathy:    She has no cervical adenopathy.    She has no axillary adenopathy.  Neurological: She is alert and oriented to person, place, and time.  Skin: Skin is warm and dry.  She has marked limitation of left shoulder movement, and she does not feel she can lay prone for more than a  few minutes.  Data Reviewed Mammogram reviewed. Biopsied area in left breast is more anterior about 4cm from the area of microcalcifications  Assessment    Left breast microcalcifications that warrant biopsy    Plan    Discussed excision of the area of concern with wire localization. She is agreeable.    Patient to be scheduled for a left breat biopsy and excision .  This patient's surgery has been scheduled for 03-10-15 at [facility].   PCP:  Fulton Reek 02/24/2015, 3:31 PM

## 2015-03-10 NOTE — Anesthesia Preprocedure Evaluation (Signed)
Anesthesia Evaluation  Patient identified by MRN, date of birth, ID band Patient awake    Reviewed: Allergy & Precautions, H&P , NPO status , Patient's Chart, lab work & pertinent test results  History of Anesthesia Complications Negative for: history of anesthetic complications  Airway Mallampati: II  TM Distance: >3 FB Neck ROM: full    Dental  (+) Poor Dentition, Chipped, Missing   Pulmonary neg pulmonary ROS, neg shortness of breath,    Pulmonary exam normal breath sounds clear to auscultation       Cardiovascular Exercise Tolerance: Good (-) Past MI negative cardio ROS Normal cardiovascular exam Rhythm:regular Rate:Normal     Neuro/Psych  Neuromuscular disease negative neurological ROS  negative psych ROS   GI/Hepatic negative GI ROS, Neg liver ROS, neg GERD  ,  Endo/Other  diabetes, Type 2, Oral Hypoglycemic Agents  Renal/GU negative Renal ROS  negative genitourinary   Musculoskeletal negative musculoskeletal ROS (+) Arthritis ,   Abdominal   Peds negative pediatric ROS (+)  Hematology negative hematology ROS (+)   Anesthesia Other Findings Past Medical History:   Diabetes mellitus without complication (HCC)                 Hyperlipidemia                                               Frozen shoulder                                                Comment:Left   Fracture of arm, left, multiple, closed                      Vitamin D deficiency                                         Gum disease                                                  Shoulder disorder                                              Comment:frozen left shoulder   Anemia                                                         Comment:H/O PREGNANCY   Neuromuscular disorder ([place])                    06-2014         Comment: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    Frozen shoulder  2016           Comment:LEFT   Reproductive/Obstetrics negative OB ROS                             Anesthesia Physical Anesthesia Plan  ASA: III  Anesthesia Plan: General LMA   Post-op Pain Management:    Induction:   Airway Management Planned:   Additional Equipment:   Intra-op Plan:   Post-operative Plan:   Informed Consent: I have reviewed the patients History and Physical, chart, labs and discussed the procedure including the risks, benefits and alternatives for the proposed anesthesia with the patient or authorized representative who has indicated his/her understanding and acceptance.   Dental Advisory Given  Plan Discussed with: Anesthesiologist, CRNA and Surgeon  Anesthesia Plan Comments:         Anesthesia Quick Evaluation

## 2015-03-10 NOTE — Transfer of Care (Signed)
Immediate Anesthesia Transfer of Care Note  Patient: Paula Mcmahon  Procedure(s) Performed: Procedure(s): BREAST BIOPSY WITH NEEDLE LOCALIZATION (Left)  Patient Location: PACU  Anesthesia Type:General  Level of Consciousness: awake, alert  and oriented  Airway & Oxygen Therapy: Patient Spontanous Breathing and Patient connected to face mask oxygen  Post-op Assessment: Report given to RN and Post -op Vital signs reviewed and stable  Post vital signs: stable  Last Vitals:  Filed Vitals:   03/10/15 1115  BP: 119/74  Pulse: 96  Temp: 38 C  Resp: 20    Complications: No apparent anesthesia complications

## 2015-03-10 NOTE — Anesthesia Postprocedure Evaluation (Signed)
  Anesthesia Post-op Note  Patient: Paula Mcmahon  Procedure(s) Performed: Procedure(s): BREAST BIOPSY WITH NEEDLE LOCALIZATION (Left)  Anesthesia type:General LMA  Patient location: PACU  Post pain: Pain level controlled  Post assessment: Post-op Vital signs reviewed, Patient's Cardiovascular Status Stable, Respiratory Function Stable, Patent Airway and No signs of Nausea or vomiting  Post vital signs: Reviewed and stable  Last Vitals:  Filed Vitals:   03/10/15 1241  BP: 133/85  Pulse: 94  Temp:   Resp: 16    Level of consciousness: awake, alert  and patient cooperative  Complications: No apparent anesthesia complications

## 2015-03-11 LAB — SURGICAL PATHOLOGY

## 2015-03-14 ENCOUNTER — Telehealth: Payer: Self-pay | Admitting: *Deleted

## 2015-03-14 NOTE — Telephone Encounter (Signed)
Patient called to see if her pathology report was back.   This patient was notified of the results accordingly. She verbalizes understanding and was instructed to keep follow up appointment with Dr. Jamal Collin as scheduled.

## 2015-03-14 NOTE — Telephone Encounter (Signed)
-----   Message from Christene Lye, MD sent at 03/14/2015  7:16 AM EDT ----- Please let pt pt know the pathology was normal.

## 2015-03-17 ENCOUNTER — Encounter: Payer: Self-pay | Admitting: General Surgery

## 2015-03-17 ENCOUNTER — Ambulatory Visit (INDEPENDENT_AMBULATORY_CARE_PROVIDER_SITE_OTHER): Payer: Federal, State, Local not specified - PPO | Admitting: General Surgery

## 2015-03-17 VITALS — BP 126/68 | HR 76 | Resp 12 | Ht 63.0 in | Wt 170.0 lb

## 2015-03-17 DIAGNOSIS — N6011 Diffuse cystic mastopathy of right breast: Secondary | ICD-10-CM

## 2015-03-17 DIAGNOSIS — N6012 Diffuse cystic mastopathy of left breast: Secondary | ICD-10-CM

## 2015-03-17 DIAGNOSIS — R920 Mammographic microcalcification found on diagnostic imaging of breast: Secondary | ICD-10-CM

## 2015-03-17 NOTE — Patient Instructions (Addendum)
Patient to return in 4 months with right sided mammogram.

## 2015-03-17 NOTE — Progress Notes (Signed)
Patient ID: Paula Mcmahon, female   DOB: 1965-08-03, 49 y.o.   MRN: [id number] Patient here today for a follow up left breast lumpectomy done on 03/10/15. Patient states she is doing well, still tender.  Incision is clean, dry, intact. Pathology benign fibrocystic tissue with microcalcifications. Right side also has tiny microcalcifications which needs a 6 month follow-up from the initial study.  Return in 4 months with right side mammogram.

## 2015-05-09 ENCOUNTER — Other Ambulatory Visit: Payer: Self-pay | Admitting: Internal Medicine

## 2015-05-14 ENCOUNTER — Other Ambulatory Visit: Payer: Self-pay | Admitting: Internal Medicine

## 2015-05-19 ENCOUNTER — Other Ambulatory Visit: Payer: Self-pay | Admitting: *Deleted

## 2015-05-19 DIAGNOSIS — R920 Mammographic microcalcification found on diagnostic imaging of breast: Secondary | ICD-10-CM

## 2015-06-09 NOTE — Addendum Note (Signed)
Encounter addended by: Coral Spikes, Rad Tech on: 06/09/2015  1:23 PM<BR>     Documentation filed: Charges VN

## 2015-08-10 ENCOUNTER — Other Ambulatory Visit: Payer: Self-pay | Admitting: General Surgery

## 2015-08-10 ENCOUNTER — Ambulatory Visit
Admission: RE | Admit: 2015-08-10 | Discharge: 2015-08-10 | Disposition: A | Discharge status: Home / Self Care | Payer: Federal, State, Local not specified - PPO | Source: Ambulatory Visit | Attending: General Surgery | Admitting: General Surgery

## 2015-08-10 DIAGNOSIS — R920 Mammographic microcalcification found on diagnostic imaging of breast: Secondary | ICD-10-CM | POA: Diagnosis present

## 2015-08-10 DIAGNOSIS — R921 Mammographic calcification found on diagnostic imaging of breast: Secondary | ICD-10-CM | POA: Insufficient documentation

## 2015-08-17 ENCOUNTER — Ambulatory Visit (INDEPENDENT_AMBULATORY_CARE_PROVIDER_SITE_OTHER): Payer: Federal, State, Local not specified - PPO | Admitting: General Surgery

## 2015-08-17 ENCOUNTER — Encounter: Payer: Self-pay | Admitting: General Surgery

## 2015-08-17 VITALS — BP 140/78 | HR 86 | Resp 12 | Ht 63.0 in | Wt 173.8 lb

## 2015-08-17 DIAGNOSIS — N6012 Diffuse cystic mastopathy of left breast: Secondary | ICD-10-CM | POA: Diagnosis not present

## 2015-08-17 DIAGNOSIS — N6011 Diffuse cystic mastopathy of right breast: Secondary | ICD-10-CM

## 2015-08-17 NOTE — Patient Instructions (Addendum)
The patient is aware to call back for any questions or concerns.  Return in 5 months with bilateral diagnostic mammogram

## 2015-08-17 NOTE — Progress Notes (Addendum)
Patient ID: Paula Mcmahon, female   DOB: 04-03-1966, 50 y.o.   MRN: [id number]  Chief Complaint  Patient presents with  . Follow-up    mammogram    HPI Paula Mcmahon is a 50 y.o. female who presents for a breast evaluation. The most recent mammogram was done on 08/10/15-right breast. Patient does perform regular self breast checks and gets regular mammograms done.   Post left breast excisional biopsy on 03/10/2015 which showed fibrocystic changes.   I have reviewed the history of present illness with the patient. HPI  Past Medical History  Diagnosis Date  . Diabetes mellitus without complication ([place])   . Hyperlipidemia   . Frozen shoulder     Left  . Fracture of arm, left, multiple, closed   . Vitamin D deficiency   . Gum disease   . Shoulder disorder     frozen left shoulder  . Anemia     H/O PREGNANCY  . 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 Frozen shoulder 2016    LEFT    Past Surgical History  Procedure Laterality Date  . External fixation arm  07/2014    left arm   . Irrigation and debridement sebaceous cyst  1988    Right Shoulder  . Cholecystectomy  1998  . Breast biopsy Left 01/28/2015    negative  . Breast biopsy Left 03/10/2015    Procedure: BREAST BIOPSY WITH NEEDLE LOCALIZATION;  Surgeon: Christene Lye, MD;  Location: [facility] ORS;  Service: General;  Laterality: Left;    Family History  Problem Relation Age of Onset  . Cancer Maternal Grandmother   . Diabetes Maternal Grandmother   . Breast cancer Maternal Grandmother 79  . Hyperlipidemia Father   . Diabetes Sister   . Diabetes Brother     Juvenile diabetes    Social History Social History  Substance Use Topics  . Smoking status: Never Smoker   . Smokeless tobacco: Never Used  . Alcohol Use: No    No Known Allergies  Current Outpatient Prescriptions  Medication Sig Dispense Refill  . atorvastatin (LIPITOR) 10 MG tablet TAKE 1 TABLET (10 MG TOTAL) BY  MOUTH DAILY. 30 tablet 3  . Cholecalciferol (VITAMIN D) 2000 UNITS tablet Take 2,000 Units by mouth daily.    Marland Kitchen gabapentin (NEURONTIN) 300 MG capsule Take 1 capsule (300 mg total) by mouth 3 (three) times daily. (Patient taking differently: Take 300 mg by mouth daily. ) 90 capsule 3  . glipiZIDE (GLUCOTROL) 5 MG tablet Take 1 tablet (5 mg total) by mouth daily before breakfast. 30 tablet 3  . JANUVIA 25 MG tablet TAKE 1 TABLET (25 MG TOTAL) BY MOUTH DAILY. 30 tablet 3  . metFORMIN (GLUCOPHAGE) 500 MG tablet TAKE 1 TABLET (500 MG TOTAL) BY MOUTH 2 (TWO) TIMES DAILY. 60 tablet 3  . topiramate (TOPAMAX) 25 MG capsule Take 25 mg by mouth 2 (two) times daily.     No current facility-administered medications for this visit.    Review of Systems Review of Systems  Constitutional: Negative.   Respiratory: Negative.   Cardiovascular: Negative.     Blood pressure 140/78, pulse 86, resp. rate 12, height 5\' 3"  (1.6 m), weight 173 lb 12.8 oz (78.835 kg), last menstrual period 08/14/2015.  Physical Exam Physical Exam  Constitutional: She is oriented to person, place, and time. She appears well-developed and well-nourished.  HENT:  Mouth/Throat: Oropharynx is clear and moist.  Eyes:  Conjunctivae are normal. No scleral icterus.  Neck: Neck supple.  Cardiovascular: Normal rate, regular rhythm and normal heart sounds.   Pulmonary/Chest: Effort normal and breath sounds normal. Right breast exhibits no inverted nipple, no mass, no nipple discharge, no skin change and no tenderness. Left breast exhibits no inverted nipple, no mass, no nipple discharge, no skin change and no tenderness.  Abdominal: Soft. Bowel sounds are normal.  Lymphadenopathy:    She has no cervical adenopathy.    She has no axillary adenopathy.  Neurological: She is alert and oriented to person, place, and time.  Skin: Skin is warm and dry.  Psychiatric: Her behavior is normal.  She seems to have much better ROM with her left  shoulder now.  Data Reviewed Mammogram right reviewed- stable 3 mm area of micro calcifications  Assessment Stable exam. FCD. Remote FH of breast cancer     Plan    Return in 5 months with bilateral diagnostic mammogram     PCP:  Ronette Deter This information has been scribed by Gaspar Cola CMA.    Marcell Chavarin G 09/27/2015, 6:44 PM

## 2015-10-20 ENCOUNTER — Other Ambulatory Visit: Payer: Self-pay | Admitting: Internal Medicine

## 2015-11-02 ENCOUNTER — Other Ambulatory Visit: Payer: Self-pay

## 2015-11-02 DIAGNOSIS — N6011 Diffuse cystic mastopathy of right breast: Secondary | ICD-10-CM

## 2015-11-02 DIAGNOSIS — N6012 Diffuse cystic mastopathy of left breast: Principal | ICD-10-CM

## 2016-01-16 ENCOUNTER — Ambulatory Visit
Admission: RE | Admit: 2016-01-16 | Discharge: 2016-01-16 | Disposition: A | Discharge status: Home / Self Care | Payer: Federal, State, Local not specified - PPO | Source: Ambulatory Visit | Attending: General Surgery | Admitting: General Surgery

## 2016-01-16 ENCOUNTER — Other Ambulatory Visit: Payer: Self-pay | Admitting: General Surgery

## 2016-01-16 DIAGNOSIS — N6011 Diffuse cystic mastopathy of right breast: Secondary | ICD-10-CM | POA: Insufficient documentation

## 2016-01-16 DIAGNOSIS — N6012 Diffuse cystic mastopathy of left breast: Principal | ICD-10-CM

## 2016-01-16 DIAGNOSIS — Z1231 Encounter for screening mammogram for malignant neoplasm of breast: Secondary | ICD-10-CM | POA: Diagnosis not present

## 2016-01-17 ENCOUNTER — Other Ambulatory Visit: Payer: Self-pay | Admitting: General Surgery

## 2016-01-17 ENCOUNTER — Telehealth: Payer: Self-pay

## 2016-01-17 ENCOUNTER — Other Ambulatory Visit: Payer: Self-pay

## 2016-01-17 DIAGNOSIS — R921 Mammographic calcification found on diagnostic imaging of breast: Secondary | ICD-10-CM

## 2016-01-17 NOTE — Telephone Encounter (Signed)
Call to patient to go over date and instructions for Stereo Biopsy of right breast. Patient has been scheduled for a left/right breast stereotactic biopsy at [facility] for 01/30/16 at 2:00 pm. She will check-in at the [facility] at 1:30 pm. This patient is aware of date, time, and instructions. Patient verbalizes understanding. She will be seen here in office by Dr Jamal Collin on 01/26/16.

## 2016-01-26 ENCOUNTER — Ambulatory Visit (INDEPENDENT_AMBULATORY_CARE_PROVIDER_SITE_OTHER): Payer: Federal, State, Local not specified - PPO | Admitting: General Surgery

## 2016-01-26 ENCOUNTER — Encounter: Payer: Self-pay | Admitting: General Surgery

## 2016-01-26 VITALS — BP 130/72 | HR 68 | Resp 14 | Ht 63.0 in | Wt 160.0 lb

## 2016-01-26 DIAGNOSIS — R920 Mammographic microcalcification found on diagnostic imaging of breast: Secondary | ICD-10-CM

## 2016-01-26 DIAGNOSIS — N6012 Diffuse cystic mastopathy of left breast: Secondary | ICD-10-CM | POA: Diagnosis not present

## 2016-01-26 DIAGNOSIS — N6011 Diffuse cystic mastopathy of right breast: Secondary | ICD-10-CM

## 2016-01-26 DIAGNOSIS — Z803 Family history of malignant neoplasm of breast: Secondary | ICD-10-CM

## 2016-01-26 NOTE — Patient Instructions (Signed)
The patient is aware to call back for any questions or concerns. Stereotactic Breast Biopsy A stereotactic breast biopsy is a procedure in which mammography is used in the collection of a sample of breast tissue. Mammography is a type of X-ray exam of the breasts that produces an image called a mammogram. The mammogram allows your health care provider to precisely locate the area of the breast from which a tissue sample will be taken. The tissue is then examined under a microscope to see if cancerous cells are present. A breast biopsy is done when:   A lump, abnormality, or mass is seen in the breast on a breast X-ray (mammogram).   Small calcium deposits (calcifications) are seen in the breast.   The shape or appearance of the breasts changes.   The shape or appearance of the nipples changes. You may have unusual or bloody discharge coming from the nipples, or you may have crusting, retraction, or dimpling of the nipples. A breast biopsy can indicate if you need surgery or other treatment.  LET [facility] CARE PROVIDER KNOW ABOUT:  Any allergies you have.  All medicines you are taking, including vitamins, herbs, eye drops, creams, and over-the-counter medicines.  Previous problems you or members of your family have had with the use of anesthetics.  Any blood disorders you have.  Previous surgeries you have had.  Medical conditions you have. RISKS AND COMPLICATIONS Generally, stereotactic breast biopsy is a safe procedure. However, as with any procedure, complications can occur. Possible complications include:  Infection at the needle-insertion site.   Bleeding or bruising after surgery.  The breast may become altered or deformed as a result of the procedure.  The needle may go through the chest wall into the lung area.  BEFORE THE PROCEDURE  Wear a supportive bra to the procedure.  You will be asked to remove jewelry, dentures, eyeglasses, metal objects, or clothing  that might interfere with the X-ray images. You may want to leave some of these objects at home.  Arrange for someone to drive you home after the procedure if desired. PROCEDURE  A stereotactic breast biopsy is done while you are awake. During the procedure, relax as much as possible. Let your health care provider know if you are uncomfortable, anxious, or in pain. Usually, the only discomfort felt during the procedure is caused by staying in one position for the length of the procedure. This discomfort can be reduced by carefully placed cushions. Most of the time the biopsy is done using a table with openings on it. You will be asked to lie facedown on the table and place your breasts through the openings. Your breast is compressed between metal plates to get good X-ray images. Your skin will be cleaned, and a numbing medicine (local anesthetic) will be injected. A small cut (incision) will be made in your breast. The tip of the biopsy needle will be directed through the incision. Several small pieces of suspicious tissue will be taken. Then, a final set of X-ray images will be obtained. If they show that the suspicious tissue has been mostly or completely removed, a small clip will be left at the biopsy site. This is done so that the biopsy site can be easily located if the results of the biopsy show that the tissue is cancerous.  After the procedure, the incision will be stitched (sutured) or taped and covered with a bandage (dressing). Your health care provider may apply a pressure dressing and an ice  pack to prevent bleeding and swelling in the breast.  A stereotactic breast biopsy can take 30 minutes or more. AFTER THE PROCEDURE  If you are doing well and have no problems, you will be allowed to go home.    This information is not intended to replace advice given to you by your health care provider. Make sure you discuss any questions you have with your health care provider.   Document Released:  02/17/2003 Document Revised: 05/26/2013 Document Reviewed: 12/18/2012 Elsevier Interactive Patient Education [employer].

## 2016-01-26 NOTE — Progress Notes (Signed)
Patient ID: Paula Mcmahon, female   DOB: Feb 15, 1966, 50 y.o.   MRN: [id number]  Chief Complaint  Patient presents with  . Follow-up    mammogram    HPI Paula Mcmahon is a 50 y.o. female.  who presents for her follow up and a breast evaluation. The most recent mammogram was done on 01-16-16 . Stereotatic biopsy scheduled for 01-30-16. Patient does perform regular self breast checks and gets regular mammograms done.  She can not feel anything different in the breast. I have reviewed the history of present illness with the patient.  HPI  Past Medical History:  Diagnosis Date  . Anemia    H/O PREGNANCY  . Diabetes mellitus without complication ([place])   . Fracture of arm, left, multiple, closed   . Frozen shoulder    Left  . 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 Gum disease   . Hyperlipidemia   . 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 Shoulder disorder    frozen left shoulder  . Vitamin D deficiency     Past Surgical History:  Procedure Laterality Date  . BREAST BIOPSY Left 01/28/2015   negative, Dr Angie Fava office  . BREAST BIOPSY Left 03/10/2015   Procedure: BREAST BIOPSY WITH NEEDLE LOCALIZATION;  Surgeon: Christene Lye, MD;  Location: [facility] ORS;  Service: General;  Laterality: Left;  . CHOLECYSTECTOMY  1998  . EXTERNAL FIXATION ARM  07/2014   left arm   . IRRIGATION AND DEBRIDEMENT SEBACEOUS CYST  1988   Right Shoulder    Family History  Problem Relation Age of Onset  . Cancer Maternal Grandmother   . Diabetes Maternal Grandmother   . Breast cancer Maternal Grandmother 79  . Hyperlipidemia Father   . Diabetes Sister   . Diabetes Brother     Juvenile diabetes    Social History Social History  Substance Use Topics  . Smoking status: Never Smoker  . Smokeless tobacco: Never Used  . Alcohol use No    No Known Allergies  Current Outpatient Prescriptions  Medication Sig Dispense Refill  . atorvastatin (LIPITOR) 10 MG tablet TAKE 1  TABLET (10 MG TOTAL) BY MOUTH DAILY. 30 tablet 3  . Cholecalciferol (VITAMIN D) 2000 UNITS tablet Take 2,000 Units by mouth daily.    Marland Kitchen glipiZIDE (GLUCOTROL) 5 MG tablet Take 5 mg by mouth.    Marland Kitchen JANUVIA 25 MG tablet TAKE 1 TABLET (25 MG TOTAL) BY MOUTH DAILY. 30 tablet 3  . metFORMIN (GLUCOPHAGE) 500 MG tablet TAKE 1 TABLET (500 MG TOTAL) BY MOUTH 2 (TWO) TIMES DAILY. 60 tablet 3   No current facility-administered medications for this visit.     Review of Systems Review of Systems  Constitutional: Negative.   Respiratory: Negative.   Cardiovascular: Negative.     Blood pressure 130/72, pulse 68, resp. rate 14, height 5\' 3"  (1.6 m), weight 160 lb (72.6 kg), last menstrual period 01/23/2016.  Physical Exam Physical Exam  Constitutional: She is oriented to person, place, and time. She appears well-developed and well-nourished.  HENT:  Mouth/Throat: Oropharynx is clear and moist.  Eyes: Conjunctivae are normal.  Neck: Neck supple.  Cardiovascular: Normal rate, regular rhythm and normal heart sounds.   Pulmonary/Chest: Effort normal and breath sounds normal. Right breast exhibits no inverted nipple, no mass, no nipple discharge, no skin change and no tenderness. Left breast exhibits no inverted nipple, no mass, no nipple discharge, no skin change and no  tenderness.  Abdominal: Soft. There is no tenderness.  Lymphadenopathy:    She has no cervical adenopathy.    She has no axillary adenopathy.  Neurological: She is alert and oriented to person, place, and time.  Skin: Skin is warm.  Psychiatric: Her behavior is normal.    Data Reviewed Mammogram reviewed with new microcalcifications.  Assessment    New linear microcalcification, mammographic right breast upper outer quadrant. FCD. Family history of breast cancer.    Plan    A stereotactic breast biopsy planned for 01-30-16.    This information has been scribed by Karie Fetch RN, BSN,BC.   SANKAR,SEEPLAPUTHUR G 01/27/2016,  10:08 AM

## 2016-01-27 ENCOUNTER — Encounter: Payer: Self-pay | Admitting: General Surgery

## 2016-01-30 ENCOUNTER — Ambulatory Visit
Admission: RE | Admit: 2016-01-30 | Discharge: 2016-01-30 | Disposition: A | Discharge status: Home / Self Care | Payer: Federal, State, Local not specified - PPO | Source: Ambulatory Visit | Attending: General Surgery | Admitting: General Surgery

## 2016-01-30 DIAGNOSIS — R921 Mammographic calcification found on diagnostic imaging of breast: Secondary | ICD-10-CM

## 2016-01-30 DIAGNOSIS — R920 Mammographic microcalcification found on diagnostic imaging of breast: Secondary | ICD-10-CM | POA: Diagnosis not present

## 2016-01-30 HISTORY — PX: BREAST BIOPSY: SHX20

## 2016-01-31 ENCOUNTER — Encounter: Payer: Self-pay | Admitting: General Surgery

## 2016-01-31 ENCOUNTER — Telehealth: Payer: Self-pay | Admitting: *Deleted

## 2016-01-31 LAB — SURGICAL PATHOLOGY

## 2016-01-31 NOTE — Telephone Encounter (Signed)
Notified patient as instructed, patient pleased. Discussed follow-up appointments, patient agrees. Benign breast biopsy per Dr Jamal Collin Recalls for 6 months/6 mos with right mammogram

## 2016-02-07 ENCOUNTER — Ambulatory Visit (INDEPENDENT_AMBULATORY_CARE_PROVIDER_SITE_OTHER): Payer: Federal, State, Local not specified - PPO | Admitting: *Deleted

## 2016-02-07 DIAGNOSIS — R920 Mammographic microcalcification found on diagnostic imaging of breast: Secondary | ICD-10-CM

## 2016-02-07 NOTE — Progress Notes (Signed)
Patient ID: Paula Mcmahon, female   DOB: January 15, 1966, 50 y.o.   MRN: [id number] Patient came in today for a wound check post stereo breast biopsy.  The wound is clean, with no signs of infection noted. Follow up as scheduled 6 months.. Moderate amount bruising. Strip strips in place.

## 2016-02-07 NOTE — Patient Instructions (Signed)
The patient is aware to call back for any questions or concerns.  

## 2016-02-23 ENCOUNTER — Telehealth: Payer: Self-pay | Admitting: General Practice

## 2016-02-23 NOTE — Telephone Encounter (Signed)
Left pt a vm to call office to sch flu shot. °

## 2016-05-14 ENCOUNTER — Other Ambulatory Visit: Payer: Self-pay

## 2016-05-14 DIAGNOSIS — R920 Mammographic microcalcification found on diagnostic imaging of breast: Secondary | ICD-10-CM

## 2016-07-25 ENCOUNTER — Ambulatory Visit: Payer: Federal, State, Local not specified - PPO | Attending: Orthopedic Surgery | Admitting: Physical Therapy

## 2016-07-25 ENCOUNTER — Encounter: Payer: Self-pay | Admitting: Physical Therapy

## 2016-07-25 DIAGNOSIS — M25512 Pain in left shoulder: Secondary | ICD-10-CM | POA: Diagnosis present

## 2016-07-25 DIAGNOSIS — R252 Cramp and spasm: Secondary | ICD-10-CM | POA: Diagnosis present

## 2016-07-25 DIAGNOSIS — M542 Cervicalgia: Secondary | ICD-10-CM | POA: Insufficient documentation

## 2016-07-25 NOTE — Therapy (Signed)
[place] [address] [place] [place], [place], [address] Phone: [phone]   Fax:  [phone]  Physical Therapy Evaluation  Patient Details  Name: Paula Mcmahon MRN: [id number] Date of Birth: [birth year] Referring Provider: Lynann Bologna  Encounter Date: 07/25/2016      PT End of Session - 07/25/16 1515    Visit Number 1   Date for PT Re-Evaluation 09/22/16   PT Start Time 1446   PT Stop Time 1533   PT Time Calculation (min) 47 min   Activity Tolerance Patient tolerated treatment well   Behavior During Therapy [facility] for tasks assessed/performed      Past Medical History:  Diagnosis Date  . Anemia    H/O PREGNANCY  . Diabetes mellitus without complication ([place])   . Fracture of arm, left, multiple, closed   . Frozen shoulder    Left  . 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 Gum disease   . Hyperlipidemia   . 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 Shoulder disorder    frozen left shoulder  . Vitamin D deficiency     Past Surgical History:  Procedure Laterality Date  . BREAST BIOPSY Left 01/28/2015   negative, Dr Angie Fava office  . BREAST BIOPSY Left 03/10/2015   Procedure: BREAST BIOPSY WITH NEEDLE LOCALIZATION;  Surgeon: Christene Lye, MD;  Location: [facility] ORS;  Service: General;  Laterality: Left;  . BREAST BIOPSY Right 01/30/2016   path pending  . CHOLECYSTECTOMY  1998  . EXTERNAL FIXATION ARM  07/2014   left arm   . IRRIGATION AND DEBRIDEMENT SEBACEOUS CYST  1988   Right Shoulder    There were no vitals filed for this visit.       Subjective Assessment - 07/25/16 1448    Subjective Patient reports that about two years ago she fell on ice, she underwent an ORIF of the left elbow.  She reports that there was some nerve damage.  She reports that she had been having some left shoulder pain since that time, but reports recently she has been having increase pain, she reports a  past shoulder manipulation due to frozen shoulder in 2016.  She had PT for about 4 months and the motions improved.   Limitations Lifting;House hold activities   Patient Stated Goals have less pain and spasms   Currently in Pain? Yes   Pain Score 6    Pain Location Shoulder   Pain Orientation Left   Pain Descriptors / Indicators Aching;Burning;Sore;Tightness;Spasm   Pain Type Acute pain   Pain Onset 1 to 4 weeks ago   Pain Frequency Constant   Aggravating Factors  turning head, using the arm all increase pain to 9-10/10   Pain Relieving Factors heat helps at best a 3-4/10   Effect of Pain on Daily Activities just difficulty with all ADL's            [facility] PT Assessment - 07/25/16 0001      Assessment   Medical Diagnosis left shoulder pain   Referring Provider [place]   Onset Date/Surgical Date 06/24/16   Hand Dominance Right   Prior Therapy 2 years ago     Precautions   Precautions None     Balance Screen   Has the patient fallen in the past 6 months No   Has the patient had a decrease in activity level because of a fear of falling?  No  Is the patient reluctant to leave their home because of a fear of falling?  No     Home Environment   Additional Comments does house and yardwork     Prior Function   Level of Independence Independent   Vocation Full time employment   Vocation Requirements on computer   Leisure walks on treadmill     Posture/Postural Control   Posture Comments fwd head and rounded shoulder     ROM / Strength   AROM / PROM / Strength AROM;Strength;PROM     AROM   Overall AROM Comments cervical ROM flexion and right rotation WNL's, extension decreased 25%, left rotation decreased 75%, decreased 75% for side bending, pain with left rotation and left side bending, left elbow extension is 24 degrees from full extension   AROM Assessment Site Shoulder   Right/Left Shoulder Left   Left Shoulder Flexion 100 Degrees   Left Shoulder ABduction 70  Degrees   Left Shoulder Internal Rotation 40 Degrees   Left Shoulder External Rotation 70 Degrees     PROM   PROM Assessment Site Shoulder   Right/Left Shoulder Left   Left Shoulder Flexion 140 Degrees   Left Shoulder ABduction 134 Degrees   Left Shoulder Internal Rotation 55 Degrees   Left Shoulder External Rotation 80 Degrees     Strength   Overall Strength Comments 4/5 in the available ROM with pain in the left shoulder and upper trap     Flexibility   Soft Tissue Assessment /Muscle Length --  some left UE neural tension     Palpation   Palpation comment very tight and tender in the left upper trap and rhomboid, tender in the left shoulder                   [facility] Adult PT Treatment/Exercise - 07/25/16 0001      Modalities   Modalities Electrical Stimulation;Moist Heat     Moist Heat Therapy   Number Minutes Moist Heat 15 Minutes   Moist Heat Location Shoulder     Electrical Stimulation   Electrical Stimulation Location left shoulder, upper trap and rhomboid area   Electrical Stimulation Action IFC   Electrical Stimulation Parameters supine   Electrical Stimulation Goals Pain                PT Education - 07/25/16 1514    Education provided Yes   Education Details cervical and scapular retractions, shoulder shrugs, gentle upper trap and levator stretches   Person(s) Educated Patient   Methods Explanation;Demonstration;Handout   Comprehension Verbalized understanding          PT Short Term Goals - 07/25/16 1519      PT SHORT TERM GOAL #1   Title independent with initial HEP   Time 2   Period Weeks   Status New           PT Long Term Goals - 07/25/16 1519      PT LONG TERM GOAL #1   Title decrease pain 50%   Time 8   Period Weeks   Status New     PT LONG TERM GOAL #2   Title increase left shoulder flexion to 120 degrees   Time 8   Period Weeks   Status New     PT LONG TERM GOAL #3   Title increase cervical ROM 25%    Time 8   Period Weeks   Status New     PT LONG TERM GOAL #  4   Title understand proper posture and body mechanics   Time 8   Period Weeks   Status New               Plan - 07/25/16 1516    Clinical Impression Statement Patient had a fall on ice about 2 years ago, she sustained an elbow fracture that required ORIF, she reports some nerve damage and she had a resultant left frozen shoulder, she had PT and reports that she felt pretty good but always had some pain, she reports that over the past month she has had worse pain and less ROM.  Her AROM is very poor, her PROM is good, her strength in the available ROM is good.  She has significant spasms in the upper trap   Rehab Potential Good   PT Frequency 2x / week   PT Duration 8 weeks   PT Treatment/Interventions ADLs/Self Care Home Management;Cryotherapy;Electrical Stimulation;Iontophoresis 4mg /ml Dexamethasone;Ultrasound;Moist Heat;Therapeutic activities;Therapeutic exercise;Neuromuscular re-education;Patient/family education;Manual techniques   PT Next Visit Plan try to get her exercising in the gym, work on scapular and core stability   Consulted and Agree with Plan of Care Patient      Patient will benefit from skilled therapeutic intervention in order to improve the following deficits and impairments:  Decreased activity tolerance, Decreased range of motion, Decreased strength, Increased fascial restricitons, Increased muscle spasms, Impaired flexibility, Postural dysfunction, Improper body mechanics, Pain  Visit Diagnosis: Cervicalgia - Plan: PT plan of care cert/re-cert  Cramp and spasm - Plan: PT plan of care cert/re-cert  Acute pain of left shoulder - Plan: PT plan of care cert/re-cert     Problem List Patient Active Problem List   Diagnosis Date Noted  . Diabetes type 2, controlled ([place]) 12/29/2014  . Screening for breast cancer 12/29/2014  . Routine general medical examination at a health care facility  12/29/2014  . Chronic pain syndrome 12/29/2014    Sumner Boast., PT 07/25/2016, 3:23 PM  [place] [address] [place], [place], [address] Phone: [phone]   Fax:  [phone]  Name: Paula Mcmahon MRN: [id number] Date of Birth: 02-28-1966

## 2016-07-30 ENCOUNTER — Other Ambulatory Visit: Payer: Federal, State, Local not specified - PPO

## 2016-07-30 ENCOUNTER — Ambulatory Visit
Admission: RE | Admit: 2016-07-30 | Discharge: 2016-07-30 | Disposition: A | Discharge status: Home / Self Care | Payer: Federal, State, Local not specified - PPO | Source: Ambulatory Visit | Attending: General Surgery | Admitting: General Surgery

## 2016-07-30 ENCOUNTER — Ambulatory Visit: Payer: Federal, State, Local not specified - PPO

## 2016-07-30 DIAGNOSIS — R920 Mammographic microcalcification found on diagnostic imaging of breast: Secondary | ICD-10-CM | POA: Diagnosis present

## 2016-07-30 DIAGNOSIS — R921 Mammographic calcification found on diagnostic imaging of breast: Secondary | ICD-10-CM | POA: Insufficient documentation

## 2016-07-31 ENCOUNTER — Encounter: Payer: Self-pay | Admitting: Physical Therapy

## 2016-07-31 ENCOUNTER — Ambulatory Visit: Payer: Federal, State, Local not specified - PPO | Admitting: Physical Therapy

## 2016-07-31 ENCOUNTER — Ambulatory Visit: Payer: Federal, State, Local not specified - PPO

## 2016-07-31 DIAGNOSIS — M25512 Pain in left shoulder: Secondary | ICD-10-CM

## 2016-07-31 DIAGNOSIS — M542 Cervicalgia: Secondary | ICD-10-CM | POA: Diagnosis not present

## 2016-07-31 DIAGNOSIS — R252 Cramp and spasm: Secondary | ICD-10-CM

## 2016-07-31 NOTE — Therapy (Signed)
[place] [address], [place], [address] Phone: [phone]   Fax:  [phone]  Physical Therapy Treatment  Patient Details  Name: Paula Mcmahon MRN: [id number] Date of Birth: September 14, 1965 Referring Provider: Lynann Bologna  Encounter Date: 07/31/2016      PT End of Session - 07/31/16 1143    Visit Number 2   Date for PT Re-Evaluation 09/22/16   PT Start Time 1103   PT Stop Time 1157   PT Time Calculation (min) 54 min   Activity Tolerance Patient tolerated treatment well   Behavior During Therapy [facility] for tasks assessed/performed      Past Medical History:  Diagnosis Date  . Anemia    H/O PREGNANCY  . Diabetes mellitus without complication ([place])   . Fracture of arm, left, multiple, closed   . Frozen shoulder    Left  . 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 Gum disease   . Hyperlipidemia   . 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 Shoulder disorder    frozen left shoulder  . Vitamin D deficiency     Past Surgical History:  Procedure Laterality Date  . BREAST BIOPSY Left 01/28/2015   negative, Dr Angie Fava office  . BREAST BIOPSY Left 03/10/2015   Procedure: BREAST BIOPSY WITH NEEDLE LOCALIZATION;  Surgeon: Christene Lye, MD;  Location: [facility] ORS;  Service: General;  Laterality: Left;  . BREAST BIOPSY Right 01/30/2016   benign  . CHOLECYSTECTOMY  1998  . EXTERNAL FIXATION ARM  07/2014   left arm   . IRRIGATION AND DEBRIDEMENT SEBACEOUS CYST  1988   Right Shoulder    There were no vitals filed for this visit.      Subjective Assessment - 07/31/16 1104    Subjective Pt reports no change since evaluation   Currently in Pain? Yes   Pain Score 6    Pain Location Shoulder   Pain Orientation Left                         [facility] Adult PT Treatment/Exercise - 07/31/16 0001      Exercises   Exercises Shoulder     Shoulder Exercises: Standing   Extension 10 reps;Theraband;Both   Theraband Level (Shoulder Extension) Level 2 (Red)   Row Both;10 reps;Theraband   Theraband Level (Shoulder Row) Level 2 (Red)   Other Standing Exercises Shoulder arcs 2x10      Shoulder Exercises: ROM/Strengthening   UBE (Upper Arm Bike) 2.5 38frd/253v     Modalities   Modalities Electrical Stimulation;Moist Heat     Moist Heat Therapy   Number Minutes Moist Heat 15 Minutes   Moist Heat Location Shoulder     Electrical Stimulation   Electrical Stimulation Location left shoulder, upper trap and rhomboid area   Electrical Stimulation Action IFC   Electrical Stimulation Parameters sitting   Electrical Stimulation Goals Pain     Manual Therapy   Manual Therapy Passive ROM   Passive ROM L shoulder all directions                  PT Short Term Goals - 07/25/16 1519      PT SHORT TERM GOAL #1   Title independent with initial HEP   Time 2   Period Weeks   Status New           PT Long Term  Goals - 07/25/16 1519      PT LONG TERM GOAL #1   Title decrease pain 50%   Time 8   Period Weeks   Status New     PT LONG TERM GOAL #2   Title increase left shoulder flexion to 120 degrees   Time 8   Period Weeks   Status New     PT LONG TERM GOAL #3   Title increase cervical ROM 25%   Time 8   Period Weeks   Status New     PT LONG TERM GOAL #4   Title understand proper posture and body mechanics   Time 8   Period Weeks   Status New               Plan - 07/31/16 1145    Clinical Impression Statement Pt tolerated an initial progression to exercises well. During the interventions pt reports that most discomfort with standing rows. Pt also with some difficulty with resisted L shoulder ER. During MT pt demo ed full AROM.   Rehab Potential Good   PT Frequency 2x / week   PT Duration 8 weeks   PT Treatment/Interventions ADLs/Self Care Home Management;Cryotherapy;Electrical Stimulation;Iontophoresis 4mg /ml  Dexamethasone;Ultrasound;Moist Heat;Therapeutic activities;Therapeutic exercise;Neuromuscular re-education;Patient/family education;Manual techniques   PT Next Visit Plan exercising in the gym, work on scapular and core stability      Patient will benefit from skilled therapeutic intervention in order to improve the following deficits and impairments:  Decreased activity tolerance, Decreased range of motion, Decreased strength, Increased fascial restricitons, Increased muscle spasms, Impaired flexibility, Postural dysfunction, Improper body mechanics, Pain  Visit Diagnosis: Cervicalgia  Cramp and spasm  Acute pain of left shoulder     Problem List Patient Active Problem List   Diagnosis Date Noted  . Diabetes type 2, controlled ([place]) 12/29/2014  . Screening for breast cancer 12/29/2014  . Routine general medical examination at a health care facility 12/29/2014  . Chronic pain syndrome 12/29/2014    Scot Jun 07/31/2016, 11:51 AM  [place] [address] [place], [place], [address] Phone: [phone]   Fax:  [phone]  Name: Paula Mcmahon MRN: [id number] Date of Birth: Apr 13, 1966

## 2016-08-02 ENCOUNTER — Ambulatory Visit (INDEPENDENT_AMBULATORY_CARE_PROVIDER_SITE_OTHER): Payer: Federal, State, Local not specified - PPO | Admitting: General Surgery

## 2016-08-02 ENCOUNTER — Encounter: Payer: Self-pay | Admitting: General Surgery

## 2016-08-02 VITALS — BP 112/80 | HR 76 | Resp 12 | Ht 63.0 in | Wt 158.0 lb

## 2016-08-02 DIAGNOSIS — R920 Mammographic microcalcification found on diagnostic imaging of breast: Secondary | ICD-10-CM

## 2016-08-02 NOTE — Patient Instructions (Signed)
The patient is aware to call back for any questions or concerns.  

## 2016-08-02 NOTE — Progress Notes (Signed)
Patient ID: Paula Mcmahon, female   DOB: 17-Sep-1965, 51 y.o.   MRN: [id number]  Chief Complaint  Patient presents with  . Follow-up    HPI Paula Mcmahon is a 51 y.o. female  who presents for a breast evaluation. The most recent mammogram was done on 07-30-16.  Patient does perform regular self breast checks and gets regular mammograms done.   No new breast issues. I have reviewed the history of present illness with the patient.  HPI  Past Medical History:  Diagnosis Date  . Anemia    H/O PREGNANCY  . Diabetes mellitus without complication ([place])   . Fracture of arm, left, multiple, closed   . Frozen shoulder    Left  . 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 Gum disease   . Hyperlipidemia   . 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 Shoulder disorder    frozen left shoulder  . Vitamin D deficiency     Past Surgical History:  Procedure Laterality Date  . BREAST BIOPSY Left 01/28/2015   negative, Dr Angie Fava office  . BREAST BIOPSY Left 03/10/2015   Procedure: BREAST BIOPSY WITH NEEDLE LOCALIZATION;  Surgeon: Christene Lye, MD;  Location: [facility] ORS;  Service: General;  Laterality: Left;  . BREAST BIOPSY Right 01/30/2016   benign  . CHOLECYSTECTOMY  1998  . EXTERNAL FIXATION ARM  07/2014   left arm   . IRRIGATION AND DEBRIDEMENT SEBACEOUS CYST  1988   Right Shoulder    Family History  Problem Relation Age of Onset  . Cancer Maternal Grandmother   . Diabetes Maternal Grandmother   . Breast cancer Maternal Grandmother   . Hyperlipidemia Father   . Diabetes Sister   . Diabetes Brother     Juvenile diabetes    Social History Social History  Substance Use Topics  . Smoking status: Never Smoker  . Smokeless tobacco: Never Used  . Alcohol use No    No Known Allergies  Current Outpatient Prescriptions  Medication Sig Dispense Refill  . atorvastatin (LIPITOR) 10 MG tablet TAKE 1 TABLET (10 MG TOTAL) BY MOUTH DAILY. 30 tablet 3  .  Cholecalciferol (VITAMIN D) 2000 UNITS tablet Take 2,000 Units by mouth daily.    Marland Kitchen glipiZIDE (GLUCOTROL) 5 MG tablet Take 5 mg by mouth.    Marland Kitchen JANUVIA 25 MG tablet TAKE 1 TABLET (25 MG TOTAL) BY MOUTH DAILY. 30 tablet 3  . metFORMIN (GLUCOPHAGE) 500 MG tablet TAKE 1 TABLET (500 MG TOTAL) BY MOUTH 2 (TWO) TIMES DAILY. 60 tablet 3   No current facility-administered medications for this visit.     Review of Systems Review of Systems  Constitutional: Negative.   Respiratory: Negative.   Cardiovascular: Negative.     Blood pressure 112/80, pulse 76, resp. rate 12, height 5\' 3"  (1.6 m), weight 158 lb (71.7 kg), last menstrual period 07/09/2016.  Physical Exam Physical Exam  Constitutional: She is oriented to person, place, and time. She appears well-developed and well-nourished.  HENT:  Mouth/Throat: Oropharynx is clear and moist.  Eyes: Conjunctivae are normal. No scleral icterus.  Neck: Neck supple.  Cardiovascular: Normal rate, regular rhythm and normal heart sounds.   Pulmonary/Chest: Effort normal and breath sounds normal. Right breast exhibits no inverted nipple, no mass, no nipple discharge, no skin change and no tenderness. Left breast exhibits no inverted nipple, no mass, no nipple discharge, no skin change and no tenderness.  Abdominal: Soft.  Lymphadenopathy:    She has no cervical adenopathy.    She has no axillary adenopathy.  Neurological: She is alert and oriented to person, place, and time.  Skin: Skin is warm and dry.  Psychiatric: Her behavior is normal.    Data Reviewed  Mammogram of right breast reviewed stable microcalcifications.  Assessment    Microcalcifications  Right breast  on mammogram which are stable Family history of breast cancer    Plan     Patient to have a bilateral diagnostic mammogram follow up in 6 months.   This information has been scribed by Karie Fetch RN, BSN,BC.    Tavionna Grout G 08/06/2016, 8:19 AM

## 2016-08-03 ENCOUNTER — Ambulatory Visit: Payer: Federal, State, Local not specified - PPO | Attending: Orthopedic Surgery | Admitting: Physical Therapy

## 2016-08-03 ENCOUNTER — Encounter: Payer: Self-pay | Admitting: Physical Therapy

## 2016-08-03 DIAGNOSIS — R252 Cramp and spasm: Secondary | ICD-10-CM | POA: Insufficient documentation

## 2016-08-03 DIAGNOSIS — M25512 Pain in left shoulder: Secondary | ICD-10-CM | POA: Diagnosis present

## 2016-08-03 DIAGNOSIS — M542 Cervicalgia: Secondary | ICD-10-CM | POA: Insufficient documentation

## 2016-08-03 NOTE — Therapy (Signed)
[place], [place], [address] Phone: [phone]   Fax:  [phone]  Physical Therapy Treatment  Patient Details  Name: Paula Mcmahon MRN: [id number] Date of Birth: 11/07/1965 Referring Provider: Lynann Bologna  Encounter Date: 08/03/2016      PT End of Session - 08/03/16 1058    Visit Number 3   Date for PT Re-Evaluation 09/22/16   PT Start Time 3354   PT Stop Time 1112   PT Time Calculation (min) 57 min   Activity Tolerance Patient tolerated treatment well   Behavior During Therapy [facility] for tasks assessed/performed      Past Medical History:  Diagnosis Date  . Anemia    H/O PREGNANCY  . Diabetes mellitus without complication ([place])   . Fracture of arm, left, multiple, closed   . Frozen shoulder    Left  . 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 Gum disease   . Hyperlipidemia   . 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 Shoulder disorder    frozen left shoulder  . Vitamin D deficiency     Past Surgical History:  Procedure Laterality Date  . BREAST BIOPSY Left 01/28/2015   negative, Dr Angie Fava office  . BREAST BIOPSY Left 03/10/2015   Procedure: BREAST BIOPSY WITH NEEDLE LOCALIZATION;  Surgeon: Christene Lye, MD;  Location: [facility] ORS;  Service: General;  Laterality: Left;  . BREAST BIOPSY Right 01/30/2016   benign  . CHOLECYSTECTOMY  1998  . EXTERNAL FIXATION ARM  07/2014   left arm   . IRRIGATION AND DEBRIDEMENT SEBACEOUS CYST  1988   Right Shoulder    There were no vitals filed for this visit.      Subjective Assessment - 08/03/16 1013    Subjective "I felt really good after last session, but it went back to it's normal that night"   Currently in Pain? Yes   Pain Score 6    Pain Location Shoulder   Pain Orientation Left                         [facility] Adult PT Treatment/Exercise - 08/03/16 0001      Shoulder Exercises: Seated    Other Seated Exercises Rows & lats 20lb 2x10      Shoulder Exercises: Standing   External Rotation 10 reps;Theraband  x2   Theraband Level (Shoulder External Rotation) Level 2 (Red)   Internal Rotation Left;10 reps;Theraband   Theraband Level (Shoulder Internal Rotation) Level 2 (Red)   Extension 10 reps;Theraband;Both   Theraband Level (Shoulder Extension) Level 2 (Red)   Row Both;Theraband;20 reps   Theraband Level (Shoulder Row) Level 4 (Blue)   Other Standing Exercises 2 level cabinet reaches 2lb x 10 flex and abd      Shoulder Exercises: ROM/Strengthening   UBE (Upper Arm Bike) 3.5 15fd/frd3     Modalities   Modalities Electrical Stimulation;Moist Heat     Moist Heat Therapy   Number Minutes Moist Heat 15 Minutes   Moist Heat Location Shoulder     Electrical Stimulation   Electrical Stimulation Location left shoulder, upper trap and rhomboid area   Electrical Stimulation Action IFC   Electrical Stimulation Parameters sitting   Electrical Stimulation Goals Pain     Manual Therapy   Manual Therapy Passive ROM   Passive ROM L shoulder all directions  PT Short Term Goals - 07/25/16 1519      PT SHORT TERM GOAL #1   Title independent with initial HEP   Time 2   Period Weeks   Status New           PT Long Term Goals - 08/03/16 1059      PT LONG TERM GOAL #1   Title decrease pain 50%   Status On-going     PT LONG TERM GOAL #2   Title increase left shoulder flexion to 120 degrees   Status Partially Met     PT LONG TERM GOAL #4   Title understand proper posture and body mechanics   Status On-going               Plan - 08/03/16 1059    Clinical Impression Statement Pt again with a positive response to MT . Pt able to complete all exercises well. She does reports a stretching / pulling sensation in teres minor and Lat area with active shoulder flexion.    Rehab Potential Good   PT Frequency 2x / week   PT Duration 8  weeks   PT Treatment/Interventions ADLs/Self Care Home Management;Cryotherapy;Electrical Stimulation;Iontophoresis 74m/ml Dexamethasone;Ultrasound;Moist Heat;Therapeutic activities;Therapeutic exercise;Neuromuscular re-education;Patient/family education;Manual techniques   PT Next Visit Plan exercising in the gym, work on scapular and core stability      Patient will benefit from skilled therapeutic intervention in order to improve the following deficits and impairments:  Decreased activity tolerance, Decreased range of motion, Decreased strength, Increased fascial restricitons, Increased muscle spasms, Impaired flexibility, Postural dysfunction, Improper body mechanics, Pain  Visit Diagnosis: Cervicalgia  Cramp and spasm  Acute pain of left shoulder     Problem List Patient Active Problem List   Diagnosis Date Noted  . Diabetes type 2, controlled ([place] 12/29/2014  . Screening for breast cancer 12/29/2014  . Routine general medical examination at a health care facility 12/29/2014  . Chronic pain syndrome 12/29/2014    RScot Jun3/07/2016, 11:01 AM  [address] [address]Phone: [phone]  Fax:  [phone] Name: Paula HEARDMRN: [id number] of Birth: [birth year]

## 2016-08-06 ENCOUNTER — Ambulatory Visit: Payer: Federal, State, Local not specified - PPO | Admitting: Physical Therapy

## 2016-08-06 ENCOUNTER — Encounter: Payer: Self-pay | Admitting: Physical Therapy

## 2016-08-06 DIAGNOSIS — M25512 Pain in left shoulder: Secondary | ICD-10-CM

## 2016-08-06 DIAGNOSIS — M542 Cervicalgia: Secondary | ICD-10-CM | POA: Diagnosis not present

## 2016-08-06 DIAGNOSIS — R252 Cramp and spasm: Secondary | ICD-10-CM

## 2016-08-06 NOTE — Therapy (Signed)
[place], [place], [address] Phone: [phone]   Fax:  [phone]  Physical Therapy Treatment  Patient Details  Name: Paula Mcmahon MRN: [id number] Date of Birth: 06/24/1965 Referring Provider: Lynann Bologna  Encounter Date: 08/06/2016      PT End of Session - 08/06/16 1349    Visit Number 4   Date for PT Re-Evaluation 09/22/16   PT Start Time 1300   PT Stop Time 1400   PT Time Calculation (min) 60 min   Activity Tolerance Patient tolerated treatment well   Behavior During Therapy [facility] for tasks assessed/performed      Past Medical History:  Diagnosis Date  . Anemia    H/O PREGNANCY  . Diabetes mellitus without complication ([place])   . Fracture of arm, left, multiple, closed   . Frozen shoulder    Left  . 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 Gum disease   . Hyperlipidemia   . 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 Shoulder disorder    frozen left shoulder  . Vitamin D deficiency     Past Surgical History:  Procedure Laterality Date  . BREAST BIOPSY Left 01/28/2015   negative, Dr Angie Fava office  . BREAST BIOPSY Left 03/10/2015   Procedure: BREAST BIOPSY WITH NEEDLE LOCALIZATION;  Surgeon: Christene Lye, MD;  Location: [facility] ORS;  Service: General;  Laterality: Left;  . BREAST BIOPSY Right 01/30/2016   benign  . CHOLECYSTECTOMY  1998  . EXTERNAL FIXATION ARM  07/2014   left arm   . IRRIGATION AND DEBRIDEMENT SEBACEOUS CYST  1988   Right Shoulder    There were no vitals filed for this visit.      Subjective Assessment - 08/06/16 1304    Subjective "I feel good after therapy, but it goes back"   Currently in Pain? Yes   Pain Score 6    Pain Location Shoulder   Pain Orientation Left   Pain Descriptors / Indicators Burning            [facility] PT Assessment - 08/06/16 0001      AROM   AROM Assessment Site Shoulder   Right/Left Shoulder  Left   Left Shoulder Flexion 150 Degrees   Left Shoulder ABduction 115 Degrees   Left Shoulder Internal Rotation 52 Degrees   Left Shoulder External Rotation 75 Degrees                     [facility] Adult PT Treatment/Exercise - 08/06/16 0001      Shoulder Exercises: Seated   Other Seated Exercises Chest press with serratus 15lb 2x10    Other Seated Exercises Rows & lats 25lb 2x10      Shoulder Exercises: Standing   External Rotation 10 reps;Theraband  x2   Theraband Level (Shoulder External Rotation) Level 2 (Red)   Internal Rotation Left;10 reps;Theraband  x2   Theraband Level (Shoulder Internal Rotation) Level 2 (Red)   Flexion 10 reps;Weights  x2   Shoulder Flexion Weight (lbs) 2   ABduction 10 reps;Weights  x2   Shoulder ABduction Weight (lbs) 1   Extension 10 reps;Theraband;Both   Theraband Level (Shoulder Extension) Level 4 (Blue)  x2     Shoulder Exercises: ROM/Strengthening   UBE (Upper Arm Bike) 3.5 75fd/frd3     Modalities   Modalities Electrical Stimulation;Moist Heat     Moist Heat Therapy  Number Minutes Moist Heat 15 Minutes   Moist Heat Location Shoulder     Electrical Stimulation   Electrical Stimulation Location left shoulder, upper trap and rhomboid area   Electrical Stimulation Action IFC   Electrical Stimulation Parameters sitting   Electrical Stimulation Goals Pain                  PT Short Term Goals - 07/25/16 1519      PT SHORT TERM GOAL #1   Title independent with initial HEP   Time 2   Period Weeks   Status New           PT Long Term Goals - 08/06/16 1350      PT LONG TERM GOAL #1   Title decrease pain 50%   Status Partially Met     PT LONG TERM GOAL #2   Title increase left shoulder flexion to 120 degrees   Status Achieved     PT LONG TERM GOAL #3   Title increase cervical ROM 25%   Status On-going     PT LONG TERM GOAL #4   Title understand proper posture and body mechanics   Status Partially  Met               Plan - 08/06/16 1349    Clinical Impression Statement Pt only reports a pulling sensation with exercises. Pt has progressed increasing her L shoulder AROM in all directions.   Rehab Potential Good   PT Frequency 2x / week   PT Duration 8 weeks   PT Treatment/Interventions ADLs/Self Care Home Management;Cryotherapy;Electrical Stimulation;Iontophoresis 48m/ml Dexamethasone;Ultrasound;Moist Heat;Therapeutic activities;Therapeutic exercise;Neuromuscular re-education;Patient/family education;Manual techniques   PT Next Visit Plan exercising in the gym, work on scapular and core stability      Patient will benefit from skilled therapeutic intervention in order to improve the following deficits and impairments:  Decreased activity tolerance, Decreased range of motion, Decreased strength, Increased fascial restricitons, Increased muscle spasms, Impaired flexibility, Postural dysfunction, Improper body mechanics, Pain  Visit Diagnosis: Cervicalgia  Cramp and spasm  Acute pain of left shoulder     Problem List Patient Active Problem List   Diagnosis Date Noted  . Diabetes type 2, controlled ([place] 12/29/2014  . Screening for breast cancer 12/29/2014  . Routine general medical examination at a health care facility 12/29/2014  . Chronic pain syndrome 12/29/2014    RScot Jun, PTA 08/06/2016, 1:51 PM  [address] [address]Phone: [phone]  Fax:  [phone] Name: Paula WESSLINGMRN: [id number] of Birth: 703/10/1965

## 2016-08-08 ENCOUNTER — Ambulatory Visit: Payer: Federal, State, Local not specified - PPO | Admitting: Physical Therapy

## 2016-08-08 ENCOUNTER — Encounter: Payer: Self-pay | Admitting: Physical Therapy

## 2016-08-08 DIAGNOSIS — M25512 Pain in left shoulder: Secondary | ICD-10-CM

## 2016-08-08 DIAGNOSIS — R252 Cramp and spasm: Secondary | ICD-10-CM

## 2016-08-08 DIAGNOSIS — M542 Cervicalgia: Secondary | ICD-10-CM

## 2016-08-08 NOTE — Therapy (Signed)
[place], [place], [address] Phone: [phone]   Fax:  [phone]  Physical Therapy Treatment  Patient Details  Name: Paula Mcmahon MRN: [id number] Date of Birth: 1966-01-15 Referring Provider: Lynann Bologna  Encounter Date: 08/08/2016      PT End of Session - 08/08/16 1512    Visit Number 5   Date for PT Re-Evaluation 09/22/16   PT Start Time 1430   PT Stop Time 1526   PT Time Calculation (min) 56 min   Activity Tolerance Patient tolerated treatment well   Behavior During Therapy [facility] for tasks assessed/performed      Past Medical History:  Diagnosis Date  . Anemia    H/O PREGNANCY  . Diabetes mellitus without complication ([place])   . Fracture of arm, left, multiple, closed   . Frozen shoulder    Left  . 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 Gum disease   . Hyperlipidemia   . 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 Shoulder disorder    frozen left shoulder  . Vitamin D deficiency     Past Surgical History:  Procedure Laterality Date  . BREAST BIOPSY Left 01/28/2015   negative, Dr Angie Fava office  . BREAST BIOPSY Left 03/10/2015   Procedure: BREAST BIOPSY WITH NEEDLE LOCALIZATION;  Surgeon: Christene Lye, MD;  Location: [facility] ORS;  Service: General;  Laterality: Left;  . BREAST BIOPSY Right 01/30/2016   benign  . CHOLECYSTECTOMY  1998  . EXTERNAL FIXATION ARM  07/2014   left arm   . IRRIGATION AND DEBRIDEMENT SEBACEOUS CYST  1988   Right Shoulder    There were no vitals filed for this visit.      Subjective Assessment - 08/08/16 1429    Subjective "Im feeling good, I can tell a difference. The muscles in my back don't feel as tight"   Currently in Pain? Yes   Pain Score 5    Pain Location Shoulder   Pain Orientation Left                         [facility] Adult PT Treatment/Exercise - 08/08/16 0001      Shoulder Exercises:  Seated   Other Seated Exercises Chest press with serratus 15lb 2x10    Other Seated Exercises Rows 2x15 25lb; lats 25lb 2x10      Shoulder Exercises: Standing   Flexion 10 reps;Weights  x32   Shoulder Flexion Weight (lbs) 3   Other Standing Exercises Shoulder arcs 2lb 2x10; biceps curls 15lb 2x10    Other Standing Exercises 2 level cabinet reaches 2lb x 10 flex and abd      Shoulder Exercises: ROM/Strengthening   UBE (Upper Arm Bike) L5  52fd/frd3     Modalities   Modalities Electrical Stimulation;Moist Heat     Moist Heat Therapy   Number Minutes Moist Heat 15 Minutes   Moist Heat Location Shoulder     Electrical Stimulation   Electrical Stimulation Location left shoulder, upper trap and rhomboid area   Electrical Stimulation Parameters sitting   Electrical Stimulation Goals Pain     Manual Therapy   Manual Therapy Passive ROM   Passive ROM L shoulder all directions                  PT Short Term Goals - 07/25/16 1519      PT  SHORT TERM GOAL #1   Title independent with initial HEP   Time 2   Period Weeks   Status New           PT Long Term Goals - 08/06/16 1350      PT LONG TERM GOAL #1   Title decrease pain 50%   Status Partially Met     PT LONG TERM GOAL #2   Title increase left shoulder flexion to 120 degrees   Status Achieved     PT LONG TERM GOAL #3   Title increase cervical ROM 25%   Status On-going     PT LONG TERM GOAL #4   Title understand proper posture and body mechanics   Status Partially Met               Plan - 08/08/16 1512    Clinical Impression Statement Pt able to complete all exercises well, does reports some soreness after exercises. Positive response to PROM, Pt reports decrease soreness after MT.   Rehab Potential Good   PT Frequency 2x / week   PT Duration 8 weeks   PT Treatment/Interventions ADLs/Self Care Home Management;Cryotherapy;Electrical Stimulation;Iontophoresis 92m/ml  Dexamethasone;Ultrasound;Moist Heat;Therapeutic activities;Therapeutic exercise;Neuromuscular re-education;Patient/family education;Manual techniques   PT Next Visit Plan exercising in the gym, work on scapular and core stability      Patient will benefit from skilled therapeutic intervention in order to improve the following deficits and impairments:  Decreased activity tolerance, Decreased range of motion, Decreased strength, Increased fascial restricitons, Increased muscle spasms, Impaired flexibility, Postural dysfunction, Improper body mechanics, Pain  Visit Diagnosis: Cervicalgia  Acute pain of left shoulder  Cramp and spasm     Problem List Patient Active Problem List   Diagnosis Date Noted  . Diabetes type 2, controlled ([place] 12/29/2014  . Screening for breast cancer 12/29/2014  . Routine general medical examination at a health care facility 12/29/2014  . Chronic pain syndrome 12/29/2014    RScot Jun PTA 08/08/2016, 3:14 PM  [address] [place] [address]Phone: [phone]  Fax:  [phone] Name: Paula LEHANMRN: [id number] of Birth: 7January 29, 1967

## 2016-08-14 ENCOUNTER — Ambulatory Visit: Payer: Federal, State, Local not specified - PPO | Admitting: Physical Therapy

## 2016-08-14 DIAGNOSIS — M542 Cervicalgia: Secondary | ICD-10-CM | POA: Diagnosis not present

## 2016-08-14 DIAGNOSIS — M25512 Pain in left shoulder: Secondary | ICD-10-CM

## 2016-08-14 DIAGNOSIS — R252 Cramp and spasm: Secondary | ICD-10-CM

## 2016-08-14 NOTE — Therapy (Signed)
[place], [place], [address] Phone: [phone]   Fax:  [phone]  Physical Therapy Treatment  Patient Details  Name: Paula Mcmahon MRN: [id number] Date of Birth: 24-Sep-1965 Referring Provider: Lynann Bologna  Encounter Date: 08/14/2016      PT End of Session - 08/14/16 1008    Visit Number 6   Date for PT Re-Evaluation 09/22/16   PT Start Time 0932   PT Stop Time 1030   PT Time Calculation (min) 58 min   Activity Tolerance Patient tolerated treatment well   Behavior During Therapy [facility] for tasks assessed/performed      Past Medical History:  Diagnosis Date  . Anemia    H/O PREGNANCY  . Diabetes mellitus without complication ([place])   . Fracture of arm, left, multiple, closed   . Frozen shoulder    Left  . 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 Gum disease   . Hyperlipidemia   . 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 Shoulder disorder    frozen left shoulder  . Vitamin D deficiency     Past Surgical History:  Procedure Laterality Date  . BREAST BIOPSY Left 01/28/2015   negative, Dr Angie Fava office  . BREAST BIOPSY Left 03/10/2015   Procedure: BREAST BIOPSY WITH NEEDLE LOCALIZATION;  Surgeon: Christene Lye, MD;  Location: [facility] ORS;  Service: General;  Laterality: Left;  . BREAST BIOPSY Right 01/30/2016   benign  . CHOLECYSTECTOMY  1998  . EXTERNAL FIXATION ARM  07/2014   left arm   . IRRIGATION AND DEBRIDEMENT SEBACEOUS CYST  1988   Right Shoulder    There were no vitals filed for this visit.      Subjective Assessment - 08/14/16 0934    Subjective "It never stops hurting." - increased pain from the weekend - doesn't know why   Patient Stated Goals have less pain and spasms   Currently in Pain? Yes   Pain Score 6    Pain Location Shoulder   Pain Orientation Left   Pain Descriptors / Indicators Burning   Pain Type Acute pain                          [facility] Adult PT Treatment/Exercise - 08/14/16 0935      Shoulder Exercises: Seated   Other Seated Exercises Chest press with serratus 20lb 2x15     Shoulder Exercises: Standing   External Rotation Strengthening;Both;10 reps;Theraband   Theraband Level (Shoulder External Rotation) Level 2 (Red)   External Rotation Limitations 2 sets - 3 sec hold   Flexion Both;10 reps;Weights   Shoulder Flexion Weight (lbs) 3   Flexion Limitations 2 sets   ABduction Both;10 reps;Weights   Shoulder ABduction Weight (lbs) 2   ABduction Limitations 2 sets   Other Standing Exercises PNF D1 flexion red tband x 15; PNF D2 flexion red tband x 15   Other Standing Exercises 2 level cabinet reaches 3lb x 15 flex and abd      Shoulder Exercises: ROM/Strengthening   UBE (Upper Arm Bike) L5 3 fwd/3 bwd   Cybex Row 15 reps   Cybex Row Limitations 2 sets - 35#     Shoulder Exercises: Stretch   Other Shoulder Stretches towel IR stretch 3 x 30 seconds     Modalities   Modalities Electrical Stimulation;Moist Heat     Moist  Heat Therapy   Number Minutes Moist Heat 15 Minutes   Moist Heat Location Shoulder     Electrical Stimulation   Electrical Stimulation Location left shoulder, upper trap and rhomboid area   Electrical Stimulation Action IFC   Electrical Stimulation Parameters to tolerance; sitting   Electrical Stimulation Goals Pain                  PT Short Term Goals - 07/25/16 1519      PT SHORT TERM GOAL #1   Title independent with initial HEP   Time 2   Period Weeks   Status New           PT Long Term Goals - 08/06/16 1350      PT LONG TERM GOAL #1   Title decrease pain 50%   Status Partially Met     PT LONG TERM GOAL #2   Title increase left shoulder flexion to 120 degrees   Status Achieved     PT LONG TERM GOAL #3   Title increase cervical ROM 25%   Status On-going     PT LONG TERM GOAL #4   Title understand proper posture  and body mechanics   Status Partially Met               Plan - 08/14/16 1008    Clinical Impression Statement Patient doing well with all strengthening. Visible weakness with overhead activities as well as with external rotation of L shoulder. Continued relief from estim and heat.    PT Treatment/Interventions ADLs/Self Care Home Management;Cryotherapy;Electrical Stimulation;Iontophoresis 49m/ml Dexamethasone;Ultrasound;Moist Heat;Therapeutic activities;Therapeutic exercise;Neuromuscular re-education;Patient/family education;Manual techniques   PT Next Visit Plan exercising in the gym, work on scapular and core stability   Consulted and Agree with Plan of Care Patient      Patient will benefit from skilled therapeutic intervention in order to improve the following deficits and impairments:  Decreased activity tolerance, Decreased range of motion, Decreased strength, Increased fascial restricitons, Increased muscle spasms, Impaired flexibility, Postural dysfunction, Improper body mechanics, Pain  Visit Diagnosis: Cervicalgia  Acute pain of left shoulder  Cramp and spasm     Problem List Patient Active Problem List   Diagnosis Date Noted  . Diabetes [place] 2, [place] 12/29/2014  . Screening for breast cancer 12/29/2014  . Routine general medical examination at a health care facility 12/29/2014  . Chronic pain syndrome 12/29/2014      SLanney Gins PT, DPT 08/14/16 11:14 AM   [address] [address]Phone: [phone]  Fax:  [phone] Name: Paula DAMICOMRN: [id number] of Birth: 7Mar 13, 1967

## 2016-08-16 ENCOUNTER — Ambulatory Visit: Payer: Federal, State, Local not specified - PPO | Admitting: Physical Therapy

## 2016-08-16 ENCOUNTER — Encounter: Payer: Self-pay | Admitting: Physical Therapy

## 2016-08-16 DIAGNOSIS — M542 Cervicalgia: Secondary | ICD-10-CM | POA: Diagnosis not present

## 2016-08-16 DIAGNOSIS — R252 Cramp and spasm: Secondary | ICD-10-CM

## 2016-08-16 DIAGNOSIS — M25512 Pain in left shoulder: Secondary | ICD-10-CM

## 2016-08-16 NOTE — Therapy (Signed)
[place], [place], [address] Phone: [phone]   Fax:  [phone]  Physical Therapy Treatment  Patient Details  Name: GELENE RECKTENWALD MRN: [id number] Date of Birth: 23-Mar-1966 Referring Provider: Lynann Bologna  Encounter Date: 08/16/2016      PT End of Session - 08/16/16 1226    Visit Number 7   Date for PT Re-Evaluation 09/22/16   PT Start Time 0962   PT Stop Time 1239   PT Time Calculation (min) 54 min   Activity Tolerance Patient tolerated treatment well   Behavior During Therapy [facility] for tasks assessed/performed      Past Medical History:  Diagnosis Date  . Anemia    H/O PREGNANCY  . Diabetes mellitus without complication ([place])   . Fracture of arm, left, multiple, closed   . Frozen shoulder    Left  . 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 Gum disease   . Hyperlipidemia   . 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 Shoulder disorder    frozen left shoulder  . Vitamin D deficiency     Past Surgical History:  Procedure Laterality Date  . BREAST BIOPSY Left 01/28/2015   negative, Dr Angie Fava office  . BREAST BIOPSY Left 03/10/2015   Procedure: BREAST BIOPSY WITH NEEDLE LOCALIZATION;  Surgeon: Christene Lye, MD;  Location: [facility] ORS;  Service: General;  Laterality: Left;  . BREAST BIOPSY Right 01/30/2016   benign  . CHOLECYSTECTOMY  1998  . EXTERNAL FIXATION ARM  07/2014   left arm   . IRRIGATION AND DEBRIDEMENT SEBACEOUS CYST  1988   Right Shoulder    There were no vitals filed for this visit.      Subjective Assessment - 08/16/16 1146    Subjective "Tuesday after therapy I was really sore that night, but today I feel a lot better"   Currently in Pain? Yes   Pain Score 5    Pain Location Shoulder   Pain Orientation Left                         [facility] Adult PT Treatment/Exercise - 08/16/16 0001      Shoulder Exercises:  Supine   External Rotation 15 reps;Weights;Left   External Rotation Weight (lbs) 2   Internal Rotation Left;15 reps;Weights   Internal Rotation Weight (lbs) 2     Shoulder Exercises: Seated   Other Seated Exercises Chest press with serratus 20lb 2x15     Shoulder Exercises: Standing   Extension 10 reps;Theraband;Both  x2   Theraband Level (Shoulder Extension) Level 4 (Blue)   Other Standing Exercises PNF D1 flexion red tband x 15; PNF D2 flexion red tband x 15     Shoulder Exercises: ROM/Strengthening   UBE (Upper Arm Bike) L5 3 fwd/3 bwd   Cybex Row 15 reps   Cybex Row Limitations 2 sets - 35#   Other ROM/Strengthening Exercises Lat pull downs 25lb 2x15      Manual Therapy   Manual Therapy Passive ROM   Passive ROM L shoulder all directions                  PT Short Term Goals - 07/25/16 1519      PT SHORT TERM GOAL #1   Title independent with initial HEP   Time 2   Period Weeks   Status New  PT Long Term Goals - 08/06/16 1350      PT LONG TERM GOAL #1   Title decrease pain 50%   Status Partially Met     PT LONG TERM GOAL #2   Title increase left shoulder flexion to 120 degrees   Status Achieved     PT LONG TERM GOAL #3   Title increase cervical ROM 25%   Status On-going     PT LONG TERM GOAL #4   Title understand proper posture and body mechanics   Status Partially Met               Plan - 08/16/16 1227    Clinical Impression Statement Pt with full PROM during MT, able to tolerated IR/ER in supine position with full ROM. Does reports some pain in the inferior lateral L scapula.   Rehab Potential Good   PT Frequency 2x / week   PT Duration 8 weeks   PT Treatment/Interventions ADLs/Self Care Home Management;Cryotherapy;Electrical Stimulation;Iontophoresis 67m/ml Dexamethasone;Ultrasound;Moist Heat;Therapeutic activities;Therapeutic exercise;Neuromuscular re-education;Patient/family education;Manual techniques   PT Next Visit  Plan exercising in the gym, work on scapular and core stability      Patient will benefit from skilled therapeutic intervention in order to improve the following deficits and impairments:  Decreased activity tolerance, Decreased range of motion, Decreased strength, Increased fascial restricitons, Increased muscle spasms, Impaired flexibility, Postural dysfunction, Improper body mechanics, Pain  Visit Diagnosis: Cervicalgia  Acute pain of left shoulder  Cramp and spasm     Problem List Patient Active Problem List   Diagnosis Date Noted  . Diabetes type 2, controlled ([place] 12/29/2014  . Screening for breast cancer 12/29/2014  . Routine general medical examination at a health care facility 12/29/2014  . Chronic pain syndrome 12/29/2014    RScot Jun3/15/2018, 12:29 PM  [address] [address]Phone: [phone]  Fax:  [phone] Name: KJILLAYNE WITTEMRN: [id number] of Birth: 7Jan 08, 1967

## 2016-08-28 IMAGING — MG MM DIAG BREAST TOMO UNI RIGHT
8 of 10 series · 8 of 18 positions shown · non-contrast
Comparison: With priors.

CLINICAL DATA: Short-term interval follow-up of right breast
calcifications.

EXAM:
DIGITAL DIAGNOSTIC RIGHT MAMMOGRAM WITH 3D TOMOSYNTHESIS AND CAD

[R ML]
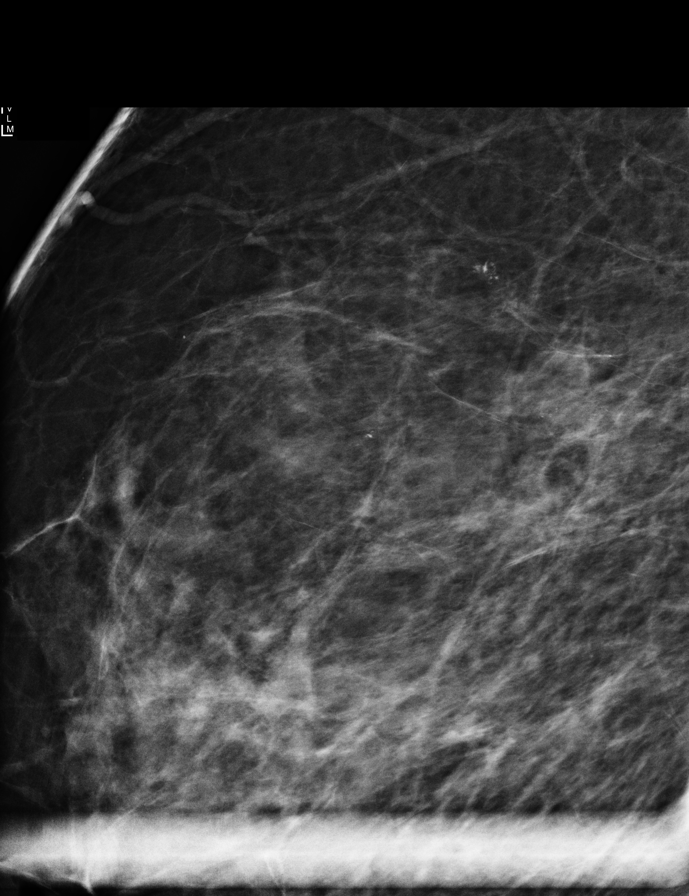

[R CC (1 of 3)]
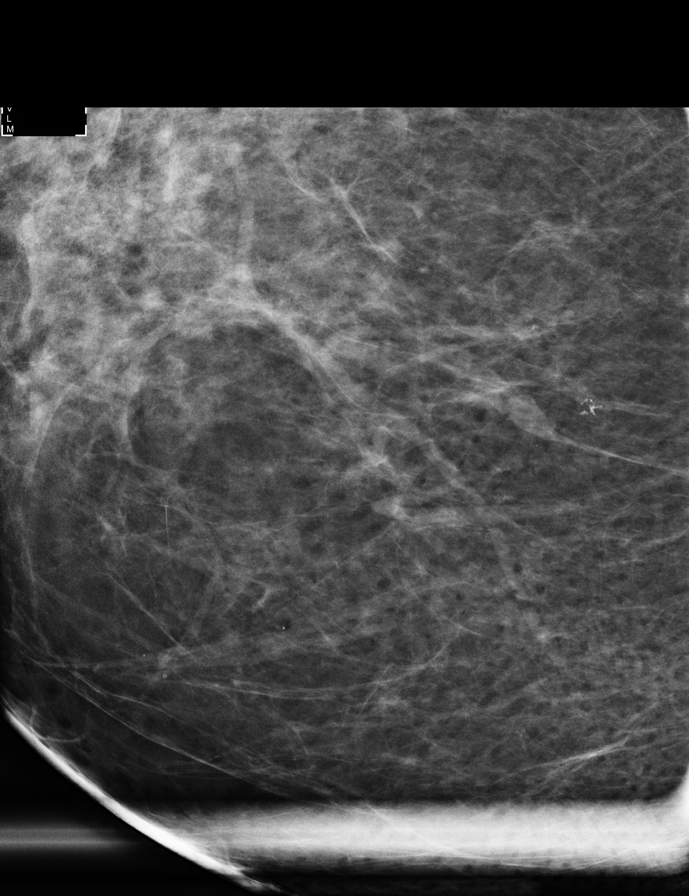

[R MLO (1 of 2)]
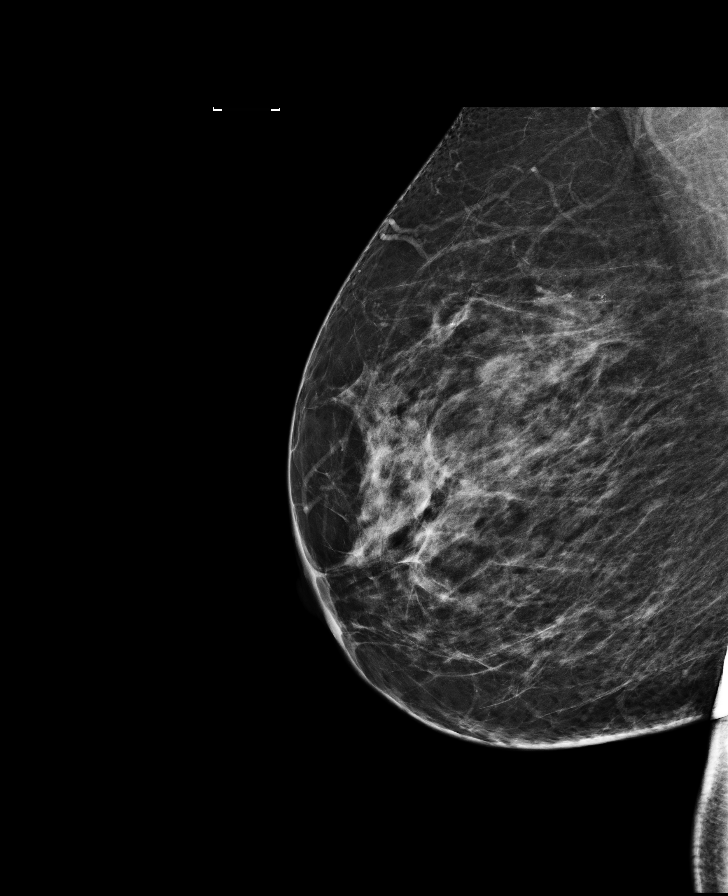

[R MLO synth-2D]
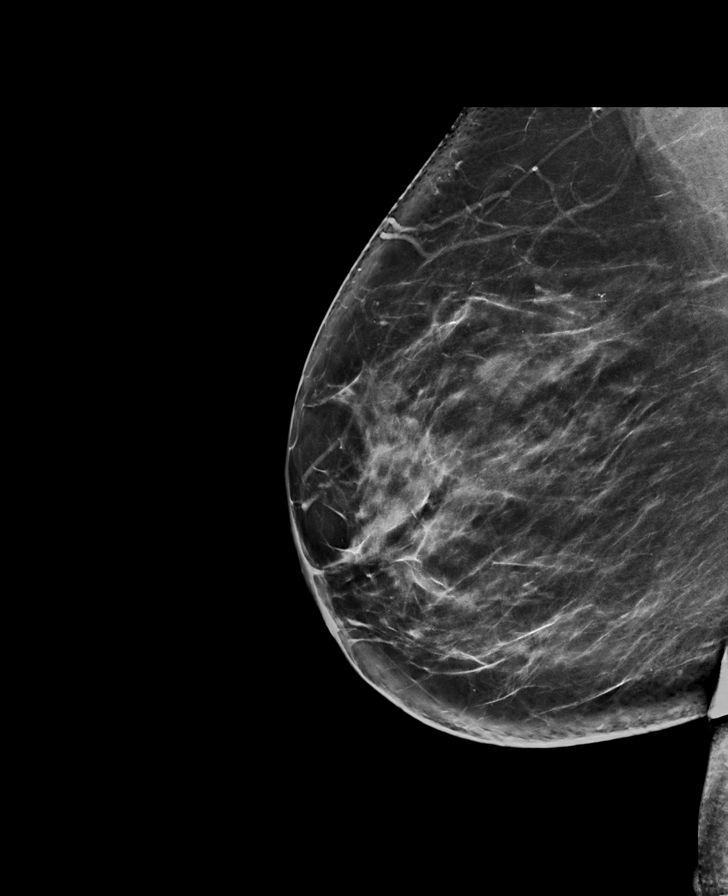

[R CC (2 of 3)]
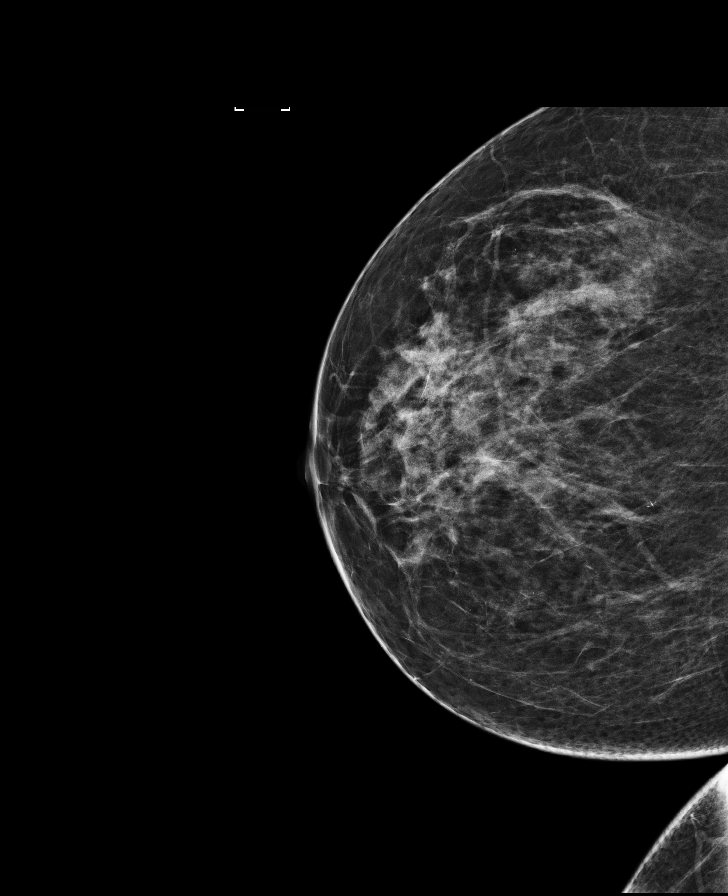

[R CC synth-2D]
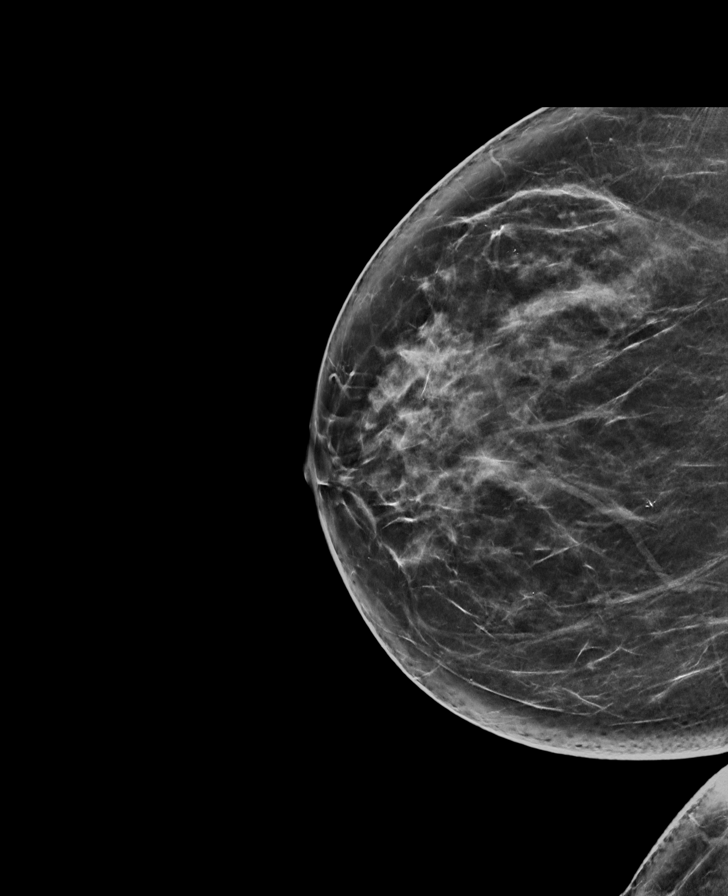

[R MLO (2 of 2)]
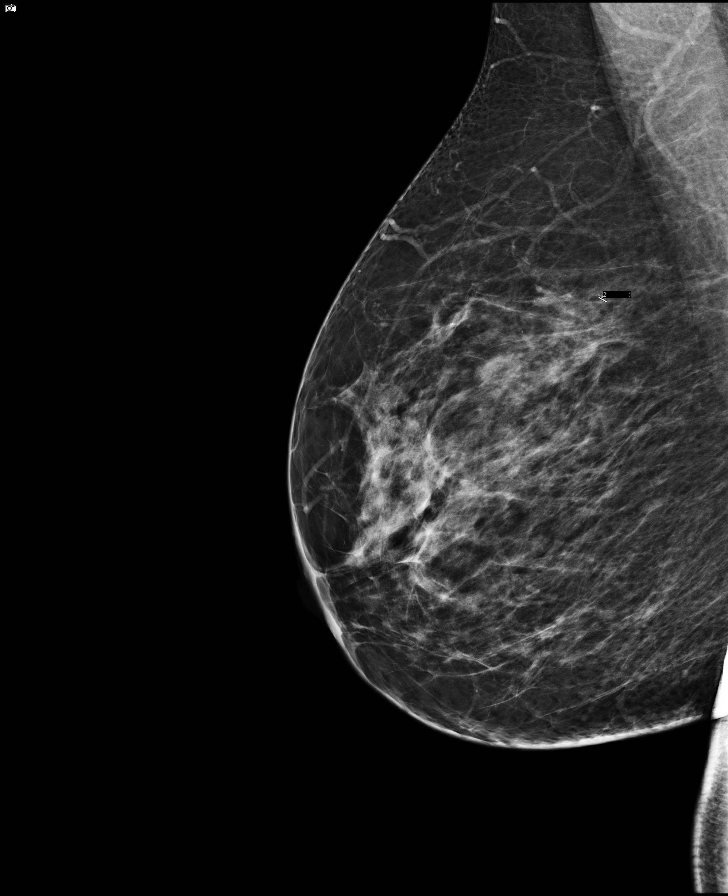

[R CC (3 of 3)]
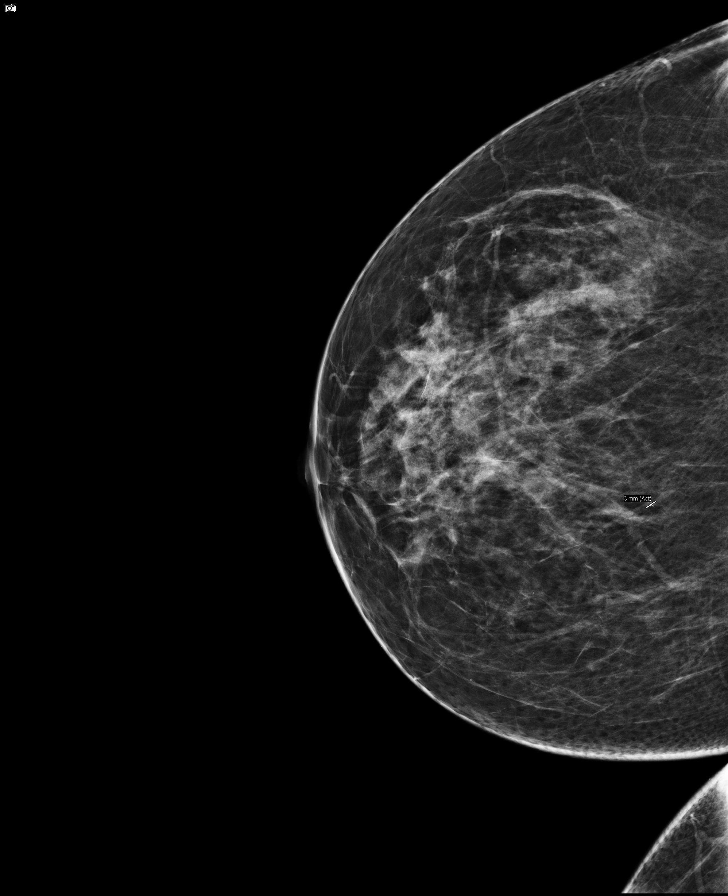

[8 of 18 positions shown; findings below may reference images not displayed]

ACR Breast Density Category c: The breast tissue is heterogeneously
dense, which may obscure small masses.
FINDINGS: 3 mm grouped coarse calcifications in the upper-inner quadrant of
the right breast have a stable appearance when compared to the
01/21/2015 study. They are felt to likely be dystrophic. No new
suspicious mass or malignant type microcalcifications identified.

Mammographic images were processed with CAD.
IMPRESSION: Stable probable benign calcifications in the right breast.

RECOMMENDATION:
Bilateral diagnostic mammogram in Sunday January, 2016 is recommended.

I have discussed the findings and recommendations with the patient.
Results were also provided in writing at the conclusion of the
visit. If applicable, a reminder letter will be sent to the patient
regarding the next appointment.

BI-RADS CATEGORY  3: Probably benign.

## 2016-08-29 ENCOUNTER — Ambulatory Visit: Payer: Federal, State, Local not specified - PPO | Admitting: Physical Therapy

## 2016-08-29 ENCOUNTER — Encounter: Payer: Self-pay | Admitting: Physical Therapy

## 2016-08-29 DIAGNOSIS — M25512 Pain in left shoulder: Secondary | ICD-10-CM

## 2016-08-29 DIAGNOSIS — M542 Cervicalgia: Secondary | ICD-10-CM | POA: Diagnosis not present

## 2016-08-29 DIAGNOSIS — R252 Cramp and spasm: Secondary | ICD-10-CM

## 2016-08-29 NOTE — Therapy (Signed)
[place], [place], [address] Phone: [phone]   Fax:  [phone]  Physical Therapy Treatment  Patient Details  Name: Paula Mcmahon MRN: [id number] Date of Birth: [birth year] Referring Provider: Lynann Bologna  Encounter Date: 08/29/2016      PT End of Session - 08/29/16 1518    Visit Number 8   Date for PT Re-Evaluation 09/22/16   PT Start Time 1430   PT Stop Time 1532   PT Time Calculation (min) 62 min   Activity Tolerance Patient tolerated treatment well   Behavior During Therapy [facility] for tasks assessed/performed      Past Medical History:  Diagnosis Date  . Anemia    H/O PREGNANCY  . Diabetes mellitus without complication ([place])   . Fracture of arm, left, multiple, closed   . Frozen shoulder    Left  . 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 Gum disease   . Hyperlipidemia   . 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 Shoulder disorder    frozen left shoulder  . Vitamin D deficiency     Past Surgical History:  Procedure Laterality Date  . BREAST BIOPSY Left 01/28/2015   negative, Dr Angie Fava office  . BREAST BIOPSY Left 03/10/2015   Procedure: BREAST BIOPSY WITH NEEDLE LOCALIZATION;  Surgeon: Christene Lye, MD;  Location: [facility] ORS;  Service: General;  Laterality: Left;  . BREAST BIOPSY Right 01/30/2016   benign  . CHOLECYSTECTOMY  1998  . EXTERNAL FIXATION ARM  07/2014   left arm   . IRRIGATION AND DEBRIDEMENT SEBACEOUS CYST  1988   Right Shoulder    There were no vitals filed for this visit.      Subjective Assessment - 08/29/16 1432    Subjective pt reports that her arm is about 70% better overall.    Currently in Pain? Yes   Pain Score 6    Pain Location Shoulder   Pain Orientation Left                         [facility] Adult PT Treatment/Exercise - 08/29/16 0001      Shoulder Exercises: Supine   External Rotation 15  reps;Weights;Left   External Rotation Weight (lbs) 2   Internal Rotation Left;15 reps;Weights   Internal Rotation Weight (lbs) 2   Other Supine Exercises LLE chest press 4lb x1      Shoulder Exercises: Standing   Extension 10 reps;Theraband;Both  x2   Theraband Level (Shoulder Extension) Level 4 (Blue)   Row Both;Theraband;20 reps   Theraband Level (Shoulder Row) Level 4 (Blue)   Other Standing Exercises PNF D2 flexion red tband x 15     Shoulder Exercises: ROM/Strengthening   UBE (Upper Arm Bike) L5 3 fwd/3 bwd   Cybex Row 15 reps   Cybex Row Limitations 2 sets - 35#   Other ROM/Strengthening Exercises chest press 20lb 2x10    Other ROM/Strengthening Exercises Lat pull downs 25lb 2x15      Shoulder Exercises: Stretch   Other Shoulder Stretches towel IR stretch 3 x 30 seconds     Modalities   Modalities Electrical Stimulation;Moist Heat     Moist Heat Therapy   Number Minutes Moist Heat 15 Minutes   Moist Heat Location Shoulder     Electrical Stimulation   Electrical Stimulation Location left shoulder, upper trap and rhomboid area  Electrical Stimulation Action IFC   Electrical Stimulation Parameters to pt tolerance    Electrical Stimulation Goals Pain     Manual Therapy   Manual Therapy Passive ROM   Passive ROM L shoulder all directions                  PT Short Term Goals - 08/29/16 1523      PT SHORT TERM GOAL #1   Title independent with initial HEP   Status Achieved           PT Long Term Goals - 08/29/16 1524      PT LONG TERM GOAL #1   Title decrease pain 50%   Status Partially Met     PT LONG TERM GOAL #2   Title increase left shoulder flexion to 120 degrees   Status Achieved     PT LONG TERM GOAL #3   Title increase cervical ROM 25%   Status Partially Met     PT LONG TERM GOAL #4   Title understand proper posture and body mechanics   Status Partially Met               Plan - 08/29/16 1524    Clinical Impression  Statement progressing towards all goals. Pt continues to have full PROM, does reports a pinching sensation at the end range of flexion. Pt with good strength and ROM with exercises.    Rehab Potential Good   PT Frequency 2x / week   PT Duration 8 weeks   PT Treatment/Interventions ADLs/Self Care Home Management;Cryotherapy;Electrical Stimulation;Iontophoresis 38m/ml Dexamethasone;Ultrasound;Moist Heat;Therapeutic activities;Therapeutic exercise;Neuromuscular re-education;Patient/family education;Manual techniques   PT Next Visit Plan exercising in the gym, work on scapular and core stability      Patient will benefit from skilled therapeutic intervention in order to improve the following deficits and impairments:  Decreased activity tolerance, Decreased range of motion, Decreased strength, Increased fascial restricitons, Increased muscle spasms, Impaired flexibility, Postural dysfunction, Improper body mechanics, Pain  Visit Diagnosis: Acute pain of left shoulder  Cramp and spasm  Cervicalgia     Problem List Patient Active Problem List   Diagnosis Date Noted  . Diabetes type 2, controlled ([place] 12/29/2014  . Screening for breast cancer 12/29/2014  . Routine general medical examination at a health care facility 12/29/2014  . Chronic pain syndrome 12/29/2014    RScot Jun PTA 08/29/2016, 3:27 PM  [address] [place] [address]Phone: [phone]  Fax:  [phone] Name: KSABAH ZUCCOMRN: [id number] of Birth: [birth year]

## 2016-09-11 ENCOUNTER — Ambulatory Visit: Payer: Federal, State, Local not specified - PPO | Attending: Orthopedic Surgery | Admitting: Physical Therapy

## 2016-09-11 ENCOUNTER — Encounter: Payer: Self-pay | Admitting: Physical Therapy

## 2016-09-11 DIAGNOSIS — M542 Cervicalgia: Secondary | ICD-10-CM

## 2016-09-11 DIAGNOSIS — R252 Cramp and spasm: Secondary | ICD-10-CM | POA: Diagnosis present

## 2016-09-11 DIAGNOSIS — M25512 Pain in left shoulder: Secondary | ICD-10-CM

## 2016-09-11 NOTE — Therapy (Signed)
[place], [place], [address] Phone: [phone]   Fax:  [phone]  Physical Therapy Treatment  Patient Details  Name: ARRIYAH MADEJ MRN: [id number] Date of Birth: 04-12-1966 Referring Provider: Lynann Bologna  Encounter Date: 09/11/2016      PT End of Session - 09/11/16 1235    Visit Number 9   Date for PT Re-Evaluation 09/22/16   PT Start Time 1230   PT Stop Time 1325   PT Time Calculation (min) 55 min      Past Medical History:  Diagnosis Date  . Anemia    H/O PREGNANCY  . Diabetes mellitus without complication ([place])   . Fracture of arm, left, multiple, closed   . Frozen shoulder    Left  . 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 Gum disease   . Hyperlipidemia   . 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 Shoulder disorder    frozen left shoulder  . Vitamin D deficiency     Past Surgical History:  Procedure Laterality Date  . BREAST BIOPSY Left 01/28/2015   negative, Dr Angie Fava office  . BREAST BIOPSY Left 03/10/2015   Procedure: BREAST BIOPSY WITH NEEDLE LOCALIZATION;  Surgeon: Christene Lye, MD;  Location: [facility] ORS;  Service: General;  Laterality: Left;  . BREAST BIOPSY Right 01/30/2016   benign  . CHOLECYSTECTOMY  1998  . EXTERNAL FIXATION ARM  07/2014   left arm   . IRRIGATION AND DEBRIDEMENT SEBACEOUS CYST  1988   Right Shoulder    There were no vitals filed for this visit.      Subjective Assessment - 09/11/16 1234    Subjective getting better still tight and post shld pain. knots in back better.   Currently in Pain? Yes   Pain Score 4    Pain Location Shoulder   Pain Orientation Left            [facility] PT Assessment - 09/11/16 0001      AROM   AROM Assessment Site Shoulder   Right/Left Shoulder Left   Left Shoulder Flexion 167 Degrees   Left Shoulder ABduction 170 Degrees   Left Shoulder Internal Rotation 75 Degrees   Left  Shoulder External Rotation 80 Degrees                     [facility] Adult PT Treatment/Exercise - 09/11/16 0001      Shoulder Exercises: Standing   External Rotation Strengthening;Left;20 reps;Weights  5# pulley   Internal Rotation Strengthening;20 reps;Left  5# pulley   Flexion Strengthening;Both;15 reps;Weights  with PNF   Shoulder Flexion Weight (lbs) 3   Other Standing Exercises pulleys 10# retraction 15 times  pulleys ext and abd 5# 15 times each   Other Standing Exercises chest press 3# 15 times  ball vs wall CC and CW 5 times each     Shoulder Exercises: ROM/Strengthening   UBE (Upper Arm Bike) L5 3 fwd/3 bwd     Modalities   Modalities Electrical Stimulation;Moist Heat     Moist Heat Therapy   Number Minutes Moist Heat 15 Minutes   Moist Heat Location Shoulder     Electrical Stimulation   Electrical Stimulation Location left shoulder, upper trap and rhomboid area   Electrical Stimulation Action IFC   Electrical Stimulation Parameters seated   Electrical Stimulation Goals Pain     Manual Therapy  Manual Therapy Joint mobilization;Soft tissue mobilization;Passive ROM   Joint Mobilization left shld   Passive ROM L shoulder all directions                  PT Short Term Goals - 08/29/16 1523      PT SHORT TERM GOAL #1   Title independent with initial HEP   Status Achieved           PT Long Term Goals - 09/11/16 1315      PT LONG TERM GOAL #1   Title decrease pain 50%   Status Partially Met     PT LONG TERM GOAL #2   Title increase left shoulder flexion to 120 degrees   Status Achieved     PT LONG TERM GOAL #3   Title increase cervical ROM 25%   Baseline WFLs   Status Achieved     PT LONG TERM GOAL #4   Title understand proper posture and body mechanics   Status Partially Met               Plan - 09/11/16 1316    Clinical Impression Statement cerv ROM WFLS. Left shld AROM significant improvements but tightnes noted  end range with Act and Pass mvmt. Progressing with all goals.   PT Next Visit Plan scap stab and strength      Patient will benefit from skilled therapeutic intervention in order to improve the following deficits and impairments:  Decreased activity tolerance, Decreased range of motion, Decreased strength, Increased fascial restricitons, Increased muscle spasms, Impaired flexibility, Postural dysfunction, Improper body mechanics, Pain  Visit Diagnosis: Acute pain of left shoulder  Cramp and spasm  Cervicalgia     Problem List Patient Active Problem List   Diagnosis Date Noted  . Diabetes type 2, controlled ([place]) 12/29/2014  . Screening for breast cancer 12/29/2014  . Routine general medical examination at a health care facility 12/29/2014  . Chronic pain syndrome 12/29/2014    Logann Whitebread,ANGIE PTA 09/11/2016, 1:20 PM  [place], [place], [address] Phone: [phone]   Fax:  [phone]  Name: LIORA MYLES MRN: [id number] Date of Birth: Nov 16, 1965

## 2016-11-19 ENCOUNTER — Other Ambulatory Visit: Payer: Self-pay

## 2016-11-19 DIAGNOSIS — R920 Mammographic microcalcification found on diagnostic imaging of breast: Secondary | ICD-10-CM

## 2017-01-24 ENCOUNTER — Ambulatory Visit
Admission: RE | Admit: 2017-01-24 | Discharge: 2017-01-24 | Disposition: A | Discharge status: Home / Self Care | Payer: Federal, State, Local not specified - PPO | Source: Ambulatory Visit | Attending: General Surgery | Admitting: General Surgery

## 2017-01-24 DIAGNOSIS — R920 Mammographic microcalcification found on diagnostic imaging of breast: Secondary | ICD-10-CM | POA: Diagnosis present

## 2017-01-31 ENCOUNTER — Encounter: Payer: Self-pay | Admitting: General Surgery

## 2017-01-31 ENCOUNTER — Ambulatory Visit (INDEPENDENT_AMBULATORY_CARE_PROVIDER_SITE_OTHER): Payer: Federal, State, Local not specified - PPO | Admitting: General Surgery

## 2017-01-31 VITALS — BP 118/82 | HR 96 | Resp 14 | Ht 63.0 in | Wt 150.0 lb

## 2017-01-31 DIAGNOSIS — N6012 Diffuse cystic mastopathy of left breast: Secondary | ICD-10-CM | POA: Diagnosis not present

## 2017-01-31 DIAGNOSIS — Z803 Family history of malignant neoplasm of breast: Secondary | ICD-10-CM

## 2017-01-31 DIAGNOSIS — N6011 Diffuse cystic mastopathy of right breast: Secondary | ICD-10-CM

## 2017-01-31 NOTE — Patient Instructions (Addendum)
  Patient wishes to return to the office in one year with a bilateral screening mammogram with Dr Bary Castilla.

## 2017-01-31 NOTE — Progress Notes (Signed)
Patient ID: Paula Mcmahon, female   DOB: [birth year], 51 y.o.   MRN: [id number]  Chief Complaint  Patient presents with  . Follow-up    HPI Paula Mcmahon is a 51 y.o. female who presents for a breast evaluation. The most recent mammogram was done on 01/24/2017.  Patient does perform regular self breast checks and gets regular mammograms done.  No new breast issues.  HPI  Past Medical History:  Diagnosis Date  . Anemia    H/O PREGNANCY  . Diabetes mellitus without complication ([place])   . Fracture of arm, left, multiple, closed   . Frozen shoulder    Left  . 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 Gum disease   . Hyperlipidemia   . 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 Shoulder disorder    frozen left shoulder  . Vitamin D deficiency     Past Surgical History:  Procedure Laterality Date  . BREAST BIOPSY Left 01/28/2015   negative, Dr Angie Fava office  . BREAST BIOPSY Left 03/10/2015   Procedure: BREAST BIOPSY WITH NEEDLE LOCALIZATION;  Surgeon: Christene Lye, MD;  Location: [facility] ORS;  Service: General;  Laterality: Left;  . BREAST BIOPSY Right 01/30/2016   benign  . CHOLECYSTECTOMY  1998  . EXTERNAL FIXATION ARM  07/2014   left arm   . IRRIGATION AND DEBRIDEMENT SEBACEOUS CYST  1988   Right Shoulder    Family History  Problem Relation Age of Onset  . Cancer Maternal Grandmother   . Diabetes Maternal Grandmother   . Breast cancer Maternal Grandmother   . Hyperlipidemia Father   . Diabetes Sister   . Diabetes Brother        Juvenile diabetes    Social History Social History  Substance Use Topics  . Smoking status: Never Smoker  . Smokeless tobacco: Never Used  . Alcohol use No    No Known Allergies  Current Outpatient Prescriptions  Medication Sig Dispense Refill  . atorvastatin (LIPITOR) 10 MG tablet TAKE 1 TABLET (10 MG TOTAL) BY MOUTH DAILY. 30 tablet 3  . Cholecalciferol (VITAMIN D) 2000 UNITS tablet Take 2,000 Units by  mouth daily.    Marland Kitchen glipiZIDE (GLUCOTROL) 5 MG tablet Take 5 mg by mouth.    Marland Kitchen JANUVIA 25 MG tablet TAKE 1 TABLET (25 MG TOTAL) BY MOUTH DAILY. 30 tablet 3  . metFORMIN (GLUCOPHAGE) 500 MG tablet TAKE 1 TABLET (500 MG TOTAL) BY MOUTH 2 (TWO) TIMES DAILY. 60 tablet 3   No current facility-administered medications for this visit.     Review of Systems Review of Systems  Constitutional: Negative.   Respiratory: Negative.   Cardiovascular: Negative.     Blood pressure 118/82, pulse 96, resp. rate 14, height 5\' 3"  (1.6 m), weight 150 lb (68 kg), last menstrual period 01/20/2017.  Physical Exam Physical Exam  Constitutional: She is oriented to person, place, and time. She appears well-developed and well-nourished.  HENT:  Mouth/Throat: Oropharynx is clear and moist.  Eyes: Conjunctivae are normal. No scleral icterus.  Neck: Neck supple.  Cardiovascular: Normal rate, regular rhythm and normal heart sounds.   Pulmonary/Chest: Effort normal and breath sounds normal. Right breast exhibits no inverted nipple, no mass, no nipple discharge, no skin change and no tenderness. Left breast exhibits no inverted nipple, no mass, no nipple discharge, no skin change and no tenderness.  Mild irregularity upper outer quadrant left breast, unchanged from before.  Abdominal: Soft.  Normal appearance. There is no tenderness.  Lymphadenopathy:    She has no cervical adenopathy.    She has no axillary adenopathy.  Neurological: She is alert and oriented to person, place, and time.  Skin: Skin is warm and dry.  Psychiatric: Her behavior is normal.    Data Reviewed Mammogram reviewed Bilateral calcifications are stable and likely benign Assessment    FCD both breast Remote family history of breast cancer    Plan   Advised on need for continued yearly mammogram, and breast exam. Patient wishes to return to the office in one year with a bilateral screening mammogram with Dr Bary Castilla.      HPI, Physical  Exam, Assessment and Plan have been scribed under the direction and in the presence of Mckinley Jewel, MD Karie Fetch, RN I have completed the exam and reviewed the above documentation for accuracy and completeness.  I agree with the above.  Haematologist has been used and any errors in dictation or transcription are unintentional.  Seeplaputhur G. Jamal Collin, M.D., F.A.C.S.  Junie Panning G 01/31/2017, 4:46 PM

## 2017-11-27 ENCOUNTER — Other Ambulatory Visit: Payer: Self-pay

## 2017-11-27 DIAGNOSIS — Z1231 Encounter for screening mammogram for malignant neoplasm of breast: Secondary | ICD-10-CM

## 2018-01-28 ENCOUNTER — Ambulatory Visit
Admission: RE | Admit: 2018-01-28 | Discharge: 2018-01-28 | Disposition: A | Discharge status: Home / Self Care | Payer: Federal, State, Local not specified - PPO | Source: Ambulatory Visit | Attending: General Surgery | Admitting: General Surgery

## 2018-01-28 DIAGNOSIS — Z1231 Encounter for screening mammogram for malignant neoplasm of breast: Secondary | ICD-10-CM | POA: Diagnosis not present

## 2018-02-04 ENCOUNTER — Encounter: Payer: Self-pay | Admitting: General Surgery

## 2018-02-04 ENCOUNTER — Ambulatory Visit: Payer: Federal, State, Local not specified - PPO | Admitting: General Surgery

## 2018-02-04 VITALS — BP 152/90 | HR 84 | Ht 63.0 in | Wt 146.8 lb

## 2018-02-04 DIAGNOSIS — N6012 Diffuse cystic mastopathy of left breast: Secondary | ICD-10-CM

## 2018-02-04 DIAGNOSIS — N6011 Diffuse cystic mastopathy of right breast: Secondary | ICD-10-CM | POA: Diagnosis not present

## 2018-02-04 NOTE — Progress Notes (Signed)
Patient ID: Paula Mcmahon, female   DOB: 08-06-1965, 52 y.o.   MRN: [id number]  Chief Complaint  Patient presents with  . Follow-up    Bilateral mammogram 01/28/2018    HPI Paula Mcmahon is a 52 y.o. female.  who presents for a breast evaluation, former patient of Dr Jamal Collin. The most recent mammogram was done on 01-28-18.  Patient does perform regular self breast checks and gets regular mammograms done.   No new breast issues. 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  HPI  Past Medical History:  Diagnosis Date  . Anemia    H/O PREGNANCY  . Diabetes mellitus without complication ([place])   . Fracture of arm, left, multiple, closed   . 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 Gum disease   . Hyperlipidemia   . 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 Shoulder disorder    frozen left shoulder  . Vitamin D deficiency     Past Surgical History:  Procedure Laterality Date  . BREAST BIOPSY Left 01/28/2015   negative, Dr Angie Fava office  . BREAST BIOPSY Left 03/10/2015   Procedure: BREAST BIOPSY WITH NEEDLE LOCALIZATION;  Surgeon: Christene Lye, MD;  Location: [facility] ORS;  Service: General;  Laterality: Left;  . BREAST BIOPSY Right 01/30/2016   benign, Dr Bary Castilla August Saucer  . BREAST EXCISIONAL BIOPSY Left 03/10/2015  . CHOLECYSTECTOMY  1998  . EXTERNAL FIXATION ARM  07/2014   left arm   . IRRIGATION AND DEBRIDEMENT SEBACEOUS CYST  1988   Right Shoulder    Family History  Problem Relation Age of Onset  . Cancer Maternal Grandmother   . Diabetes Maternal Grandmother   . Breast cancer Maternal Grandmother   . Hyperlipidemia Father   . Diabetes Sister   . Diabetes Brother        Juvenile diabetes    Social History Social History   Tobacco Use  . Smoking status: Never Smoker  . Smokeless tobacco: Never Used  Substance Use Topics  . Alcohol use: No    Alcohol/week: 0.0 standard drinks  . Drug use: No    No Known Allergies  Current Outpatient  Medications  Medication Sig Dispense Refill  . acetaminophen (TYLENOL) 500 MG tablet Take by mouth.    . Cholecalciferol (VITAMIN D) 2000 UNITS tablet Take 2,000 Units by mouth daily.    Marland Kitchen glipiZIDE (GLUCOTROL) 5 MG tablet Take 5 mg by mouth.    Marland Kitchen JANUVIA 25 MG tablet TAKE 1 TABLET (25 MG TOTAL) BY MOUTH DAILY. 30 tablet 3  . metFORMIN (GLUCOPHAGE) 500 MG tablet TAKE 1 TABLET (500 MG TOTAL) BY MOUTH 2 (TWO) TIMES DAILY. 60 tablet 3  . pregabalin (LYRICA) 50 MG capsule Take by mouth.     No current facility-administered medications for this visit.     Review of Systems Review of Systems  Constitutional: Negative.   Respiratory: Negative.   Cardiovascular: Negative.     Blood pressure (!) 152/90, pulse 84, height 5\' 3"  (1.6 m), weight 146 lb 12.8 oz (66.6 kg).  Physical Exam Physical Exam  Constitutional: She is oriented to person, place, and time. She appears well-developed and well-nourished.  HENT:  Mouth/Throat: Oropharynx is clear and moist.  Eyes: Conjunctivae are normal. No scleral icterus.  Neck: Neck supple.  Cardiovascular: Normal rate, regular rhythm and normal heart sounds.  Pulmonary/Chest: Effort normal and breath sounds normal. Right breast exhibits no inverted nipple, no mass, no  nipple discharge, no skin change and no tenderness. Left breast exhibits no inverted nipple, no mass, no nipple discharge, no skin change and no tenderness.    Lymphadenopathy:    She has no cervical adenopathy.    She has no axillary adenopathy.  Neurological: She is alert and oriented to person, place, and time.  Skin: Skin is warm and dry.  Psychiatric: Her behavior is normal.    Data Reviewed Bilateral screening mammograms dated January 28, 2018 reviewed.  BI-RADS-1.  Assessment    Stable breast exam, no additional concern on 2019 mammograms as compared to 2018.    Plan    Patient will be asked to return to the office in one year with a bilateral screening mammogram.  This  may be her last subspecialty follow-up based on normal imaging. The patient is aware to call back for any questions or new concerns.      HPI, Physical Exam, Assessment and Plan have been scribed under the direction and in the presence of Robert Bellow, MD. Paula Fetch, RN  I have completed the exam and reviewed the above documentation for accuracy and completeness.  I agree with the above.  Haematologist has been used and any errors in dictation or transcription are unintentional.  Hervey Ard, M.D., F.A.C.S.  Forest Gleason Darril Patriarca 02/04/2018, 7:48 PM

## 2018-02-04 NOTE — Progress Notes (Deleted)
Patient ID: Paula Mcmahon, female   DOB: [birth year], 52 y.o.   MRN: [id number]  Chief Complaint  Patient presents with  . Follow-up    Bilateral mammogram 01/30/2018    HPI Paula Mcmahon is a 52 y.o. female.  who presents for a breast evaluation, former patient of Dr Jamal Collin. The most recent mammogram was done on 01-29-18.  Patient does perform regular self breast checks and gets regular mammograms done.   No new breast issues.  HPI  Past Medical History:  Diagnosis Date  . Anemia    H/O PREGNANCY  . Diabetes mellitus without complication ([place])   . Fracture of arm, left, multiple, closed   . Frozen shoulder    Left  . 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 Gum disease   . Hyperlipidemia   . 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 Shoulder disorder    frozen left shoulder  . Vitamin D deficiency     Past Surgical History:  Procedure Laterality Date  . BREAST BIOPSY Left 01/28/2015   negative, Dr Angie Fava office  . BREAST BIOPSY Left 03/10/2015   Procedure: BREAST BIOPSY WITH NEEDLE LOCALIZATION;  Surgeon: Christene Lye, MD;  Location: [facility] ORS;  Service: General;  Laterality: Left;  . BREAST BIOPSY Right 01/30/2016   benign, Dr Bary Castilla August Saucer  . BREAST EXCISIONAL BIOPSY Left 03/10/2015  . CHOLECYSTECTOMY  1998  . EXTERNAL FIXATION ARM  07/2014   left arm   . IRRIGATION AND DEBRIDEMENT SEBACEOUS CYST  1988   Right Shoulder    Family History  Problem Relation Age of Onset  . Cancer Maternal Grandmother   . Diabetes Maternal Grandmother   . Breast cancer Maternal Grandmother   . Hyperlipidemia Father   . Diabetes Sister   . Diabetes Brother        Juvenile diabetes    Social History Social History   Tobacco Use  . Smoking status: Never Smoker  . Smokeless tobacco: Never Used  Substance Use Topics  . Alcohol use: No    Alcohol/week: 0.0 standard drinks  . Drug use: No    No Known Allergies  Current Outpatient Medications   Medication Sig Dispense Refill  . acetaminophen (TYLENOL) 500 MG tablet Take by mouth.    . Cholecalciferol (VITAMIN D) 2000 UNITS tablet Take 2,000 Units by mouth daily.    Marland Kitchen glipiZIDE (GLUCOTROL) 5 MG tablet Take 5 mg by mouth.    Marland Kitchen JANUVIA 25 MG tablet TAKE 1 TABLET (25 MG TOTAL) BY MOUTH DAILY. 30 tablet 3  . metFORMIN (GLUCOPHAGE) 500 MG tablet TAKE 1 TABLET (500 MG TOTAL) BY MOUTH 2 (TWO) TIMES DAILY. 60 tablet 3  . pregabalin (LYRICA) 50 MG capsule Take by mouth.     No current facility-administered medications for this visit.     Review of Systems Review of Systems  Constitutional: Negative.   Respiratory: Negative.   Cardiovascular: Negative.     Blood pressure (!) 152/90, pulse 84, height 5\' 3"  (1.6 m), weight 146 lb 12.8 oz (66.6 kg).  Physical Exam Physical Exam  Constitutional: She is oriented to person, place, and time. She appears well-developed and well-nourished.  HENT:  Mouth/Throat: Oropharynx is clear and moist.  Eyes: Conjunctivae are normal. No scleral icterus.  Neck: Neck supple.  Cardiovascular: Normal rate, regular rhythm and normal heart sounds.  Pulmonary/Chest: Effort normal and breath sounds normal. Right breast exhibits no inverted nipple, no mass, no  nipple discharge, no skin change and no tenderness. Left breast exhibits no inverted nipple, no mass, no nipple discharge, no skin change and no tenderness.  Lymphadenopathy:    She has no cervical adenopathy.    She has no axillary adenopathy.  Neurological: She is alert and oriented to person, place, and time.  Skin: Skin is warm and dry.  Psychiatric: Her behavior is normal.    Data Reviewed ***  Assessment    ***    Plan    ***       [name] 02/04/2018, 5:02 PM

## 2018-02-04 NOTE — Patient Instructions (Signed)
The patient is aware to call back for any questions or concerns.  

## 2018-02-04 NOTE — Progress Notes (Signed)
error 

## 2018-12-01 ENCOUNTER — Other Ambulatory Visit: Payer: Self-pay | Admitting: *Deleted

## 2018-12-01 DIAGNOSIS — Z1231 Encounter for screening mammogram for malignant neoplasm of breast: Secondary | ICD-10-CM

## 2018-12-22 ENCOUNTER — Encounter: Payer: Self-pay | Admitting: General Surgery

## 2018-12-29 ENCOUNTER — Other Ambulatory Visit: Payer: Self-pay

## 2018-12-29 DIAGNOSIS — Z1231 Encounter for screening mammogram for malignant neoplasm of breast: Secondary | ICD-10-CM

## 2019-01-28 ENCOUNTER — Telehealth: Payer: Self-pay

## 2019-01-28 NOTE — Telephone Encounter (Signed)
Call to patient to see about scheduling her with one of our providers for an exam after her mammogram scheduled for 02/11/19. I have left a message for her to call back.

## 2019-02-05 ENCOUNTER — Ambulatory Visit
Admission: RE | Admit: 2019-02-05 | Discharge: 2019-02-05 | Disposition: A | Discharge status: Home / Self Care | Payer: Federal, State, Local not specified - PPO | Source: Ambulatory Visit | Attending: Surgery | Admitting: Surgery

## 2019-02-05 DIAGNOSIS — Z1231 Encounter for screening mammogram for malignant neoplasm of breast: Secondary | ICD-10-CM | POA: Diagnosis present

## 2019-02-19 ENCOUNTER — Ambulatory Visit: Payer: Federal, State, Local not specified - PPO | Admitting: General Surgery

## 2019-02-19 ENCOUNTER — Encounter: Payer: Self-pay | Admitting: General Surgery

## 2019-02-19 ENCOUNTER — Other Ambulatory Visit: Payer: Self-pay

## 2019-02-19 VITALS — BP 146/96 | HR 87 | Temp 97.5°F | Ht 66.0 in | Wt 144.0 lb

## 2019-02-19 DIAGNOSIS — N6011 Diffuse cystic mastopathy of right breast: Secondary | ICD-10-CM

## 2019-02-19 DIAGNOSIS — N6012 Diffuse cystic mastopathy of left breast: Secondary | ICD-10-CM

## 2019-02-19 NOTE — Patient Instructions (Signed)
Please continue to do your monthly self exams and if you find a new lump or any concerns at all, call our office immediately.   Breast Self-Awareness Introduction Breast self-awareness means:  Knowing how your breasts look.  Knowing how your breasts feel.  Checking your breasts every month for changes.  Telling your doctor if you notice a change in your breasts. Breast self-awareness allows you to notice a breast problem early while it is still small. How to do a breast self-exam One way to learn what is normal for your breasts and to check for changes is to do a breast self-exam. To do a breast self-exam: Look for Changes  1. Take off all the clothes above your waist. 2. Stand in front of a mirror in a room with good lighting. 3. Put your hands on your hips. 4. Push your hands down. 5. Look at your breasts and nipples in the mirror to see if one breast or nipple looks different than the other. Check to see if:  The shape of one breast is different.  The size of one breast is different.  There are wrinkles, dips, and bumps in one breast and not the other. 6. Look at each breast for changes in your skin, such as:  Redness.  Scaly areas. 7. Look for changes in your nipples, such as:  Liquid around the nipples.  Bleeding.  Dimpling.  Redness.  A change in where the nipples are. Feel for Changes 1. Lie on your back on the floor. 2. Feel each breast. To do this, follow these steps:  Pick a breast to feel.  Put the arm closest to that breast above your head.  Use your other arm to feel the nipple area of your breast. Feel the area with the pads of your three middle fingers by making small circles with your fingers. For the first circle, press lightly. For the second circle, press harder. For the third circle, press even harder.  Keep making circles with your fingers at the light, harder, and even harder pressures as you move down your breast. Stop when you feel your  ribs.  Move your fingers a little toward the center of your body.  Start making circles with your fingers again, this time going up until you reach your collarbone.  Keep making up and down circles until you reach your armpit. Remember to keep using the three pressures.  Feel the other breast in the same way. 3. Sit or stand in the shower or tub. 4. With soapy water on your skin, feel each breast the same way you did in step 2, when you were lying on the floor. Write Down What You Find  After doing the self-exam, write down:  What is normal for each breast.  Any changes you find in each breast.  When you last had your period. How often should I check my breasts? Check your breasts every month. If you are breastfeeding, the best time to check them is after you feed your baby or after you use a breast pump. If you get periods, the best time to check your breasts is 5-7 days after your period is over. When should I see my doctor? See your doctor if you notice:  A change in shape or size of your breasts or nipples.  A change in the skin of your breast or nipples, such as red or scaly skin.  Unusual fluid coming from your nipples.  A lump or thick area that  was not there before.  Pain in your breasts.  Anything that concerns you. This information is not intended to replace advice given to you by your health care provider. Make sure you discuss any questions you have with your health care provider. Document Released: 11/07/2007 Document Revised: 10/27/2015 Document Reviewed: 04/10/2015  2017 Elsevier

## 2019-02-19 NOTE — Progress Notes (Signed)
Subjective:     Patient ID: Paula Mcmahon, female   DOB: 04/12/1966, 53 y.o.   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  She is undergone a number of biopsies as well as surgical excision of a fibroadenoma.  She has had multiple mammograms that have demonstrated stability over time.  She is here for follow-up after the most recent study:  CLINICAL DATA:  Screening.  EXAM: DIGITAL SCREENING BILATERAL MAMMOGRAM WITH TOMO AND CAD  COMPARISON:  Previous exam(s).  ACR Breast Density Category c: The breast tissue is heterogeneously dense, which may obscure small masses.  FINDINGS: There are no findings suspicious for malignancy. Images were processed with CAD.  IMPRESSION: No mammographic evidence of malignancy. A result letter of this screening mammogram will be mailed directly to the patient.  RECOMMENDATION: Screening mammogram in one year. (Code:SM-B-01Y)  BI-RADS CATEGORY  1: Negative.  She has not noticed any lumps in her breast.  She feels a thickening near the scar in her left breast, but no mass.  She has not noticed any skin changes and denies any nipple drainage.  Past Medical History:  Diagnosis Date  . Anemia    H/O PREGNANCY  . Diabetes mellitus without complication ([place])   . Fracture of arm, left, multiple, closed   . 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 Gum disease   . Hyperlipidemia   . 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 Shoulder disorder    frozen left shoulder  . Vitamin D deficiency    Past Surgical History:  Procedure Laterality Date  . BREAST BIOPSY Left 01/28/2015   negative, Dr Angie Fava office  . BREAST BIOPSY Left 03/10/2015   Procedure: BREAST BIOPSY WITH NEEDLE LOCALIZATION;  Surgeon: Christene Lye, MD;  Location: [facility] ORS;  Service: General;  Laterality: Left;  . BREAST  BIOPSY Right 01/30/2016   benign, Dr Bary Castilla August Saucer  . BREAST EXCISIONAL BIOPSY Left 03/10/2015  . CHOLECYSTECTOMY  1998  . EXTERNAL FIXATION ARM  07/2014   left arm   . IRRIGATION AND DEBRIDEMENT SEBACEOUS CYST  1988   Right Shoulder   Family History  Problem Relation Age of Onset  . Cancer Maternal Grandmother   . Diabetes Maternal Grandmother   . Breast cancer Maternal Grandmother   . Hyperlipidemia Father   . Diabetes Sister   . Diabetes Brother        Juvenile diabetes   Social History   Tobacco Use  . Smoking status: Never Smoker  . Smokeless tobacco: Never Used  Substance Use Topics  . Alcohol use: No    Alcohol/week: 0.0 standard drinks  . Drug use: No   Current Meds  Medication Sig  . acetaminophen (TYLENOL) 500 MG tablet Take by mouth.  . Cholecalciferol (VITAMIN D) 2000 UNITS tablet Take 2,000 Units by mouth daily.  Marland Kitchen glipiZIDE (GLUCOTROL) 5 MG tablet Take 5 mg by mouth.  Marland Kitchen JANUVIA 25 MG tablet TAKE 1 TABLET (25 MG TOTAL) BY MOUTH DAILY.  . metFORMIN (GLUCOPHAGE) 500 MG tablet TAKE 1 TABLET (500 MG TOTAL) BY MOUTH 2 (TWO) TIMES DAILY.  . [DISCONTINUED] pregabalin (LYRICA) 50 MG capsule Take by mouth.   No Known Allergies        Today's Vitals   02/19/19 1602  BP: (!) 146/96  Pulse: 87  Temp: Marland Kitchen)  97.5 F (36.4 C)  TempSrc: Skin  SpO2: 98%  Weight: 144 lb (65.3 kg)  Height: 5\' 6"  (1.676 m)   Body mass index is 23.24 kg/m.  Objective:   Physical Exam Constitutional:      General: She is not in acute distress.    Appearance: She is normal weight.  HENT:     Head: Normocephalic and atraumatic.     Nose:     Comments: Covered with a mask secondary to COVID-19 precautions    Mouth/Throat:     Comments: Covered with a mask secondary to COVID-19 precautions Eyes:     General: No scleral icterus.       Right eye: No discharge.        Left eye: No discharge.  Cardiovascular:     Rate and Rhythm: Normal rate.  Pulmonary:     Effort: Pulmonary  effort is normal.  Chest:     Breasts:        Right: Normal.        Left: Normal.       Comments: Dense fibroglandular breast tissue.  Scar from prior biopsy on left breast.  There is some minor thickening just deep to the incision site.  No associated mass. Lymphadenopathy:     Upper Body:     Right upper body: No supraclavicular, axillary or pectoral adenopathy.     Left upper body: No supraclavicular, axillary or pectoral adenopathy.  Neurological:     Mental Status: She is alert.        Assessment:     This is a 53 year old woman who had an abnormal mammogram in 2016.  Subsequent evaluation and multiple biopsies have only identified calcified fibroadenomas and other benign findings.  Her most recent mammogram was BI-RADS 1 (Negative).    Plan:     At this time, there is no need for ongoing surgical follow-up.  She should continue to perform monthly self exams and have annual mammograms.  She should follow-up with her primary care providers.  We will see her on an as-needed basis.

## 2019-06-11 ENCOUNTER — Other Ambulatory Visit: Payer: Self-pay

## 2019-06-11 ENCOUNTER — Telehealth: Payer: Self-pay | Admitting: *Deleted

## 2019-06-11 DIAGNOSIS — Z8481 Family history of carrier of genetic disease: Secondary | ICD-10-CM

## 2019-06-11 NOTE — Telephone Encounter (Signed)
Message left for Tanya Nones at the [facility].

## 2019-06-11 NOTE — Telephone Encounter (Signed)
Patient called and was wondering if we can order her a genetic test. She stated that her daughter was tested and came back positive for BRCA 2 gene. Please call and advise

## 2019-06-11 NOTE — Telephone Encounter (Signed)
Spoke with Tanya Nones and she said to refer the patient to the [facility] at [facility]. The referral has been placed and they will contact the patient.

## 2019-06-11 NOTE — Telephone Encounter (Signed)
Spoke with patient and let her know that we do not order those here. We normally have patient's referred to the Cancer center for genetic counseling and they can order the genetic testing. She is amendable to this.

## 2019-07-02 ENCOUNTER — Other Ambulatory Visit: Payer: Self-pay

## 2019-07-02 ENCOUNTER — Encounter: Payer: Self-pay | Admitting: Licensed Clinical Social Worker

## 2019-07-02 ENCOUNTER — Inpatient Hospital Stay: Payer: Federal, State, Local not specified - PPO

## 2019-07-02 ENCOUNTER — Inpatient Hospital Stay: Payer: Federal, State, Local not specified - PPO | Attending: Oncology | Admitting: Licensed Clinical Social Worker

## 2019-07-02 DIAGNOSIS — Z800 Family history of malignant neoplasm of digestive organs: Secondary | ICD-10-CM

## 2019-07-02 DIAGNOSIS — Z801 Family history of malignant neoplasm of trachea, bronchus and lung: Secondary | ICD-10-CM | POA: Insufficient documentation

## 2019-07-02 DIAGNOSIS — Z803 Family history of malignant neoplasm of breast: Secondary | ICD-10-CM | POA: Insufficient documentation

## 2019-07-02 DIAGNOSIS — Z8481 Family history of carrier of genetic disease: Secondary | ICD-10-CM

## 2019-07-02 NOTE — Progress Notes (Signed)
REFERRING PROVIDER: Fredirick Maudlin, MD [address] [place],  [place] [address]  PRIMARY PROVIDER:  Patient, No Pcp Per  PRIMARY REASON FOR VISIT:  1. Family history of breast cancer   2. Family history of colon cancer   3. Family history of lung cancer   4. Family history of BRCA2 gene positive    I connected with Paula Mcmahon on 07/02/2019 at 9:00 AM EDT by Webex and verified that I am speaking with the correct person using two identifiers.    Paula Mcmahon location: [place] Provider location: [place]:   Paula Mcmahon, a 54 y.o. female, was seen for a [place] cancer genetics consultation at the request of Dr. Celine Ahr due to a family history of cancer and her daughter's recent genetic testing that revealed a BRCA2 mutation.  Paula Mcmahon presents to clinic today to discuss the possibility of a hereditary predisposition to cancer, genetic testing, and to further clarify her future cancer risks, as well as potential cancer risks for family members.   Paula Mcmahon is a 54 y.o. female with no personal history of cancer.    CANCER HISTORY:  Oncology History   No history exists.     RISK FACTORS:  Menarche was at age 8.  First live birth at age 36.  OCP use for approximately 5 years.  Ovaries intact: yes.  Hysterectomy: no.  Menopausal status: perimenopausal.  HRT use: 0 years. Colonoscopy: no; not examined. Mammogram within the last year: yes. Number of breast biopsies: 3. Up to date with pelvic exams: yes. Any excessive radiation exposure in the past: no  Past Medical History:  Diagnosis Date  . Anemia    H/O PREGNANCY  . Diabetes mellitus without complication ([place])   . Family history of BRCA2 gene positive   . Family history of breast cancer   . Family history of colon cancer   . Family history of lung cancer   . Fracture of arm, left, multiple, closed   . 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 Gum disease   . Hyperlipidemia    . 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 Shoulder disorder    frozen left shoulder  . Vitamin D deficiency     Past Surgical History:  Procedure Laterality Date  . BREAST BIOPSY Left 01/28/2015   negative, Dr Angie Fava office  . BREAST BIOPSY Left 03/10/2015   Procedure: BREAST BIOPSY WITH NEEDLE LOCALIZATION;  Surgeon: Christene Lye, MD;  Location: [facility] ORS;  Service: General;  Laterality: Left;  . BREAST BIOPSY Right 01/30/2016   benign, Dr Bary Castilla [facility]  . BREAST EXCISIONAL BIOPSY Left 03/10/2015  . CHOLECYSTECTOMY  1998  . EXTERNAL FIXATION ARM  07/2014   left arm   . IRRIGATION AND DEBRIDEMENT SEBACEOUS CYST  1988   Right Shoulder    Social History   Socioeconomic History  . Marital status: Married    Spouse name: Not on file  . Number of children: Not on file  . Years of education: Not on file  . Highest education level: Not on file  Occupational History  . Not on file  Tobacco Use  . Smoking status: Never Smoker  . Smokeless tobacco: Never Used  Substance and Sexual Activity  . Alcohol use: No    Alcohol/week: 0.0 standard drinks  . Drug use: No  . Sexual activity: Not on file  Other Topics Concern  .  Not on file  Social History Narrative   Lives in [place] with husband and daughters.   Dog and cat in home.      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 Difficulty of Paying Living Expenses: Not on file  Food Insecurity:   . Worried About Charity fundraiser in the Last Year: Not on file  . Ran Out of Food in the Last Year: Not on file  Transportation Needs:   . Lack of Transportation (Medical): Not on file  . Lack of Transportation (Non-Medical): Not on file  Physical Activity:   . Days of Exercise per Week: Not on file  . Minutes of Exercise per Session: Not on file   Stress:   . Feeling of Stress : Not on file  Social Connections:   . Frequency of Communication with Friends and Family: Not on file  . Frequency of Social Gatherings with Friends and Family: Not on file  . Attends Religious Services: Not on file  . Active Member of Clubs or Organizations: Not on file  . Attends Archivist Meetings: Not on file  . Marital Status: Not on file     FAMILY HISTORY:  We obtained a detailed, 4-generation family history.  Significant diagnoses are listed below: Family History  Problem Relation Age of Onset  . Cancer Maternal Grandmother   . Diabetes Maternal Grandmother   . Breast cancer Maternal Grandmother        dx 25s  . Hyperlipidemia Father   . Diabetes Sister   . Diabetes Brother        Juvenile diabetes  . Colon cancer Paternal Aunt        dx 40s, d. 51s  . Lung cancer Maternal Grandfather   . Cancer Paternal Grandmother        d. 33s, unk type  . Cancer Paternal Grandfather        unk type   Paula Mcmahon has 2 daughters. One of her daughters, [name], 20, recently tested positive for a BRCA2 mutation. She does not have the report with her at this visit but will email me a copy of it. Paula Mcmahon has 4 maternal half sisters and 1 maternal half brother, none have had cancer.  Paula Mcmahon mother is living at 66 with no history of cancer. Paula Mcmahon has 1 maternal aunt and 1 maternal uncle, no cancers. No known cancers in maternal cousins. Maternal grandmother was diagnosed with breast cancer in her 74s and died at 63. Maternal grandfather was diagnosed with lung cancer, no history of smoking, and died at 65.  Paula Mcmahon father is living at 27 and has no history of cancer, he has had several colon polyps. Paula Mcmahon had 2 paternal aunts. One aunt was diagnosed with colon cancer in her 43s and died in her 55s. Paula Mcmahon does not have paternal first cousins. Paternal grandmother did have cancer but she is unsure the type and died in her 65s. Paternal  grandfather had cancer, unsure the type, died in his 25s.   Paula Mcmahon is aware of previous family history of genetic testing for hereditary cancer risks. Paula Mcmahon's maternal ancestors are of English/Irish/German descent, and paternal ancestors are of English/Irish/German descent. There is no reported Ashkenazi Jewish ancestry. There is no known consanguinity.  GENETIC COUNSELING ASSESSMENT: Ms. Gaetz is a 54 y.o. female with a family history of a BRCA2 mutation in her daughter, and a family history which is somewhat suggestive of Lynch syndrome. We, therefore, discussed and recommended the following at today's visit.   DISCUSSION: We discussed that 5 - 10% of cancer is hereditary. Based on her daughters' testing, there is a 50% chance that Ms. Brummitt has the same  BRCA2 mutation. Additionally, we discussed Lynch syndrome given her family history of young colon cancer.  We discussed that testing is beneficial for several reasons including knowing how to follow individuals for cancer screenings, and understand if other family members could be at risk for cancer and allow them to undergo genetic testing.   We reviewed the characteristics, features and inheritance patterns of hereditary cancer syndromes. We also discussed genetic testing, including the appropriate family members to test, the process of testing, insurance coverage and turn-around-time for results. We discussed the implications of a negative, positive and/or variant of uncertain significant result. We recommended Ms. Binstock pursue genetic testing for the [facility] Common Hereditary Cancers gene panel.   The Common Hereditary Cancers Panel offered by [facility] includes sequencing and/or deletion duplication testing of the following 48 genes: APC, ATM, AXIN2, BARD1, BMPR1A, BRCA1, BRCA2, BRIP1, CDH1, CDKN2A (p14ARF), CDKN2A (p16INK4a), CKD4, CHEK2, CTNNA1, DICER1, EPCAM (Deletion/duplication testing only), GREM1 (promoter region deletion/duplication testing  only), KIT, MEN1, MLH1, MSH2, MSH3, MSH6, MUTYH, NBN, NF1, NHTL1, PALB2, PDGFRA, PMS2, POLD1, POLE, PTEN, RAD50, RAD51C, RAD51D, RNF43, SDHB, SDHC, SDHD, SMAD4, SMARCA4. STK11, TP53, TSC1, TSC2, and VHL.  The following genes were evaluated for sequence changes only: SDHA and HOXB13 c.251G>A variant only.  Based on Ms. Mahlum's family history of cancer and BRCA2 mutation, she meets medical criteria for genetic testing. Despite that she meets criteria, she may still have an out of pocket cost.   PLAN: After considering the risks, benefits, and limitations, Ms. Feigenbaum provided informed consent to pursue genetic testing and the blood sample was sent to [facility] for analysis of the Common Hereditary Cancers Panel. Results should be available within approximately 2-3 weeks' time, at which point they will be disclosed by telephone to Ms. Mogel, as will any additional recommendations warranted by these results. Ms. Prowell will receive a summary of her genetic counseling visit and a copy of her results once available. This information will also be available in Epic.   Ms. Stave questions were answered to her satisfaction today. Our contact information was provided should additional questions or concerns arise. Thank you for the referral and allowing [place] to share in the care of your Paula Mcmahon.   Faith Rogue, MS, [facility] Genetic Counselor [email or web address] Phone: [phone]  The Paula Mcmahon was seen for a total of 30 minutes in virtual genetic counseling.  Drs. Magrinat, Lindi Adie and/or Burr Medico were available for discussion regarding this case.   _______________________________________________________________________ For Office Staff:  Number of people involved in session: 1 Was an Intern/ student involved with case: no

## 2019-07-17 ENCOUNTER — Ambulatory Visit: Payer: Self-pay | Admitting: Licensed Clinical Social Worker

## 2019-07-17 ENCOUNTER — Telehealth: Payer: Self-pay | Admitting: Licensed Clinical Social Worker

## 2019-07-17 ENCOUNTER — Encounter: Payer: Self-pay | Admitting: Licensed Clinical Social Worker

## 2019-07-17 DIAGNOSIS — Z8481 Family history of carrier of genetic disease: Secondary | ICD-10-CM

## 2019-07-17 DIAGNOSIS — Z803 Family history of malignant neoplasm of breast: Secondary | ICD-10-CM

## 2019-07-17 DIAGNOSIS — Z800 Family history of malignant neoplasm of digestive organs: Secondary | ICD-10-CM

## 2019-07-17 DIAGNOSIS — Z1379 Encounter for other screening for genetic and chromosomal anomalies: Secondary | ICD-10-CM

## 2019-07-17 DIAGNOSIS — Z801 Family history of malignant neoplasm of trachea, bronchus and lung: Secondary | ICD-10-CM

## 2019-07-17 HISTORY — DX: Encounter for other screening for genetic and chromosomal anomalies: Z13.79

## 2019-07-17 NOTE — Telephone Encounter (Signed)
Revealed negative genetic testing. Negative for known BRCA2 mutation in her daughter + the other 13 genes we looked at. Revealed that a VUS in MSH2 was identified. This normal result is reassuring. It is unlikely that there is an increased risk of cancer due to a mutation in one of these genes.  However, genetic testing is not perfect, and cannot definitively rule out a hereditary cause.  It will be important for her to keep in contact with genetics to learn if any additional testing may be needed in the future.

## 2019-07-17 NOTE — Progress Notes (Signed)
HPI:  Paula Mcmahon was previously seen in the [place] clinic due to a family history of cancer, her daughter's recent genetic testing that revealed a BRCA2 mutation and concerns regarding a hereditary predisposition to cancer. Please refer to our prior cancer genetics clinic note for more information regarding our discussion, assessment and recommendations, at the time. Paula Mcmahon recent genetic test results were disclosed to her, as were recommendations warranted by these results. These results and recommendations are discussed in more detail below.  CANCER HISTORY:  Oncology History   No history exists.    FAMILY HISTORY:  We obtained a detailed, 4-generation family history.  Significant diagnoses are listed below: Family History  Problem Relation Age of Onset  . Cancer Maternal Grandmother   . Diabetes Maternal Grandmother   . Breast cancer Maternal Grandmother        dx 45s  . Hyperlipidemia Father   . Diabetes Sister   . Diabetes Brother        Juvenile diabetes  . Colon cancer Paternal Aunt        dx 63s, d. 23s  . Lung cancer Maternal Grandfather   . Cancer Paternal Grandmother        d. 35s, unk type  . Cancer Paternal Grandfather        unk type   Paula Mcmahon has 2 daughters. One of her daughters, [name], 51, recently tested positive for a BRCA2 mutation. She does not have the report with her at this visit but will email me a copy of it. Paula Mcmahon has 4 maternal half sisters and 1 maternal half brother, none have had cancer.  Paula Mcmahon mother is living at 8 with no history of cancer. Patient has 1 maternal aunt and 1 maternal uncle, no cancers. No known cancers in maternal cousins. Maternal grandmother was diagnosed with breast cancer in her 54s and died at 71. Maternal grandfather was diagnosed with lung cancer, no history of smoking, and died at 24.  Paula Mcmahon father is living at 86 and has no history of cancer, he has had several colon polyps. Patient had 2  paternal aunts. One aunt was diagnosed with colon cancer in her 84s and died in her 63s. Patient does not have paternal first cousins. Paternal grandmother did have cancer but she is unsure the type and died in her 42s. Paternal grandfather had cancer, unsure the type, died in his 55s.   Paula Mcmahon is aware of previous family history of genetic testing for hereditary cancer risks. Patient's maternal ancestors are of English/Irish/German descent, and paternal ancestors are of English/Irish/German descent. There is no reported Ashkenazi Jewish ancestry. There is no known consanguinity.   GENETIC TEST RESULTS: Genetic testing reported out on 07/16/2019 through the [facility] Common Hereditary cancer panel found no pathogenic mutations.  The Common Hereditary Cancers Panel offered by [facility] includes sequencing and/or deletion duplication testing of the following 47 genes: APC, ATM, AXIN2, BARD1, BMPR1A, BRCA1, BRCA2, BRIP1, CDH1, CDKN2A (p14ARF), CDKN2A (p16INK4a), CKD4, CHEK2, CTNNA1, DICER1, EPCAM (Deletion/duplication testing only), GREM1 (promoter region deletion/duplication testing only), KIT, MEN1, MLH1, MSH2, MSH3, MSH6, MUTYH, NBN, NF1, NHTL1, PALB2, PDGFRA, PMS2, POLD1, POLE, PTEN, RAD50, RAD51C, RAD51D, SDHB, SDHC, SDHD, SMAD4, SMARCA4. STK11, TP53, TSC1, TSC2, and VHL.  The following genes were evaluated for sequence changes only: SDHA and HOXB13 c.251G>A variant only.  The test report has been scanned into EPIC and is located under the Molecular Pathology section of the Results Review tab.  A  portion of the result report is included below for reference.     We discussed with Paula Mcmahon that because current genetic testing is not perfect, it is possible there may be a gene mutation in one of these genes that current testing cannot detect, but that chance is small.  We also discussed, that there could be another gene that has not yet been discovered, or that we have not yet tested, that is responsible  for the cancer diagnoses in the family. It is also possible there is a hereditary cause for the cancer in the family that Paula Mcmahon did not inherit and therefore was not identified in her testing.  Therefore, it is important to remain in touch with cancer genetics in the future so that we can continue to offer Paula Mcmahon the most up to date genetic testing.   Additionally, this result confirms that the BRCA2 mutation in her daughter is not coming from her. This means her daughter's father is an obligate carrier of the BRCA2 mutation and his side of the family should have testing and counseling.   Genetic testing did identify a variant of uncertain significance (VUS) was identified in the MSH2 gene  At this time, it is unknown if this variant is associated with increased cancer risk or if this is a normal finding, but most variants such as this get reclassified to being inconsequential. It should not be used to make medical management decisions. With time, we suspect the lab will determine the significance of this variant, if any. If we do learn more about it, we will try to contact Paula Mcmahon to discuss it further. However, it is important to stay in touch with [place] periodically and keep the address and phone number up to date.  ADDITIONAL GENETIC TESTING: We discussed with Paula Mcmahon that her genetic testing was fairly extensive.  If there are genes identified to increase cancer risk that can be analyzed in the future, we would be happy to discuss and coordinate this testing at that time.    CANCER SCREENING RECOMMENDATIONS: Paula Mcmahon test result is considered negative (normal).  This means that we have not identified a hereditary cause for her  family history of cancer at this time, and that the BRCA2 mutation in her daughter is coming from her daughter's father.    While reassuring, this does not definitively rule out a hereditary predisposition to cancer. It is still possible that there could be genetic  mutations that are undetectable by current technology. There could be genetic mutations in genes that have not been tested or identified to increase cancer risk.  Therefore, it is recommended she continue to follow the cancer management and screening guidelines provided by her  primary healthcare provider.   An individual's cancer risk and medical management are not determined by genetic test results alone. Overall cancer risk assessment incorporates additional factors, including personal medical history, family history, and any available genetic information that may result in a personalized plan for cancer prevention and surveillance.  RECOMMENDATIONS FOR FAMILY MEMBERS:  Relatives in this family might be at some increased risk of developing cancer, over the general population risk, simply due to the family history of cancer.  We recommended female relatives in this family have a yearly mammogram beginning at age 70, or 33 years younger than the earliest onset of cancer, an annual clinical breast exam, and perform monthly breast self-exams. Female relatives in this family should also have a gynecological exam as recommended by  their primary provider. All family members should have a colonoscopy by age 75, or as directed by their physicians.   It is also possible there is a hereditary cause for the cancer in Ms. Bircher's family that she did not inherit and therefore was not identified in her.  Based on Paula Mcmahon's family history, we recommended her paternal aunt and father have genetic counseling and testing given the family history of colon cancer. Paula Mcmahon will let us know if we can be of any assistance in coordinating genetic counseling and/or testing for these family members.  FOLLOW-UP: Lastly, we discussed with Paula Mcmahon that cancer genetics is a rapidly advancing field and it is possible that new genetic tests will be appropriate for her and/or her family members in the future. We encouraged her to remain  in contact with cancer genetics on an annual basis so we can update her personal and family histories and let her know of advances in cancer genetics that may benefit this family.   Our contact number was provided. Ms. Massey questions were answered to her satisfaction, and she knows she is welcome to call us at anytime with additional questions or concerns.   Faith Rogue, MS, [facility] Genetic Counselor [email or web address] Phone: [phone]

## 2019-12-28 ENCOUNTER — Other Ambulatory Visit: Payer: Self-pay | Admitting: General Surgery

## 2019-12-28 DIAGNOSIS — Z1231 Encounter for screening mammogram for malignant neoplasm of breast: Secondary | ICD-10-CM

## 2020-02-10 ENCOUNTER — Ambulatory Visit
Admission: RE | Admit: 2020-02-10 | Discharge: 2020-02-10 | Disposition: A | Discharge status: Home / Self Care | Payer: Federal, State, Local not specified - PPO | Source: Ambulatory Visit | Attending: General Surgery | Admitting: General Surgery

## 2020-02-10 DIAGNOSIS — Z1231 Encounter for screening mammogram for malignant neoplasm of breast: Secondary | ICD-10-CM

## 2021-01-24 ENCOUNTER — Encounter: Payer: Self-pay | Admitting: Licensed Clinical Social Worker

## 2021-01-24 NOTE — Progress Notes (Signed)
UPDATE: VUS in MSH2 called c.701C>T has been reclassified to "Likely Benign." The report date is 01/19/2021.

## 2021-03-10 ENCOUNTER — Encounter: Payer: Self-pay | Admitting: General Surgery

## 2023-09-30 ENCOUNTER — Emergency Department
Admission: EM | Admit: 2023-09-30 | Discharge: 2023-09-30 | Disposition: A | Discharge status: Home / Self Care | Attending: Emergency Medicine | Admitting: Emergency Medicine

## 2023-09-30 ENCOUNTER — Other Ambulatory Visit: Payer: Self-pay

## 2023-09-30 ENCOUNTER — Emergency Department

## 2023-09-30 ENCOUNTER — Ambulatory Visit
Admission: EM | Admit: 2023-09-30 | Discharge: 2023-09-30 | Disposition: A | Discharge status: Other Acute Inpatient Hospital | Source: Home / Self Care

## 2023-09-30 DIAGNOSIS — N179 Acute kidney failure, unspecified: Secondary | ICD-10-CM

## 2023-09-30 DIAGNOSIS — E860 Dehydration: Secondary | ICD-10-CM | POA: Insufficient documentation

## 2023-09-30 DIAGNOSIS — R059 Cough, unspecified: Secondary | ICD-10-CM | POA: Diagnosis not present

## 2023-09-30 DIAGNOSIS — R112 Nausea with vomiting, unspecified: Secondary | ICD-10-CM | POA: Diagnosis not present

## 2023-09-30 DIAGNOSIS — R739 Hyperglycemia, unspecified: Secondary | ICD-10-CM

## 2023-09-30 DIAGNOSIS — E871 Hypo-osmolality and hyponatremia: Secondary | ICD-10-CM | POA: Diagnosis not present

## 2023-09-30 DIAGNOSIS — N3000 Acute cystitis without hematuria: Secondary | ICD-10-CM | POA: Diagnosis not present

## 2023-09-30 DIAGNOSIS — E1165 Type 2 diabetes mellitus with hyperglycemia: Secondary | ICD-10-CM | POA: Insufficient documentation

## 2023-09-30 LAB — URINALYSIS, ROUTINE W REFLEX MICROSCOPIC
Bilirubin Urine: NEGATIVE
Glucose, UA: >=500 mg/dL — AB
Hgb urine dipstick: NEGATIVE
Ketones, ur: 5 mg/dL — AB
Nitrite: NEGATIVE
Protein, ur: NEGATIVE mg/dL
Specific Gravity, Urine: 1.011 (ref 1.005–1.030)
pH: 5 (ref 5.0–8.0)

## 2023-09-30 LAB — URINALYSIS, W/ REFLEX TO CULTURE (INFECTION SUSPECTED)
Glucose, UA: 250 mg/dL — AB
Ketones, ur: 80 mg/dL — AB
Nitrite: NEGATIVE
Protein, ur: 30 mg/dL — AB
Specific Gravity, Urine: 1.025 (ref 1.005–1.030)
pH: 5 (ref 5.0–8.0)

## 2023-09-30 LAB — COMPREHENSIVE METABOLIC PANEL WITH GFR
ALT: 17 U/L (ref 0–44)
ALT: 17 U/L (ref 0–44)
AST: 18 U/L (ref 15–41)
AST: 18 U/L (ref 15–41)
Albumin: 4.4 g/dL (ref 3.5–5.0)
Albumin: 4.3 g/dL (ref 3.5–5.0)
Alkaline Phosphatase: 90 U/L (ref 38–126)
Alkaline Phosphatase: 75 U/L (ref 38–126)
Anion gap: 16 — ABNORMAL HIGH (ref 5–15)
Anion gap: 16 — ABNORMAL HIGH (ref 5–15)
BUN: 33 mg/dL — ABNORMAL HIGH (ref 6–20)
BUN: 33 mg/dL — ABNORMAL HIGH (ref 6–20)
CO2: 25 mmol/L (ref 22–32)
CO2: 23 mmol/L (ref 22–32)
Calcium: 9.4 mg/dL (ref 8.9–10.3)
Calcium: 9.4 mg/dL (ref 8.9–10.3)
Chloride: 91 mmol/L — ABNORMAL LOW (ref 98–111)
Chloride: 94 mmol/L — ABNORMAL LOW (ref 98–111)
Creatinine, Ser: 1.57 mg/dL — ABNORMAL HIGH (ref 0.44–1.00)
Creatinine, Ser: 1.55 mg/dL — ABNORMAL HIGH (ref 0.44–1.00)
GFR, Estimated: 39 mL/min — ABNORMAL LOW (ref >60)
GFR, Estimated: 39 mL/min — ABNORMAL LOW (ref >60)
Glucose, Bld: 371 mg/dL — ABNORMAL HIGH (ref 70–99)
Glucose, Bld: 316 mg/dL — ABNORMAL HIGH (ref 70–99)
Potassium: 4.4 mmol/L (ref 3.5–5.1)
Potassium: 3.7 mmol/L (ref 3.5–5.1)
Sodium: 132 mmol/L — ABNORMAL LOW (ref 135–145)
Sodium: 133 mmol/L — ABNORMAL LOW (ref 135–145)
Total Bilirubin: 1.1 mg/dL (ref 0.0–1.2)
Total Bilirubin: 0.9 mg/dL (ref 0.0–1.2)
Total Protein: 8.9 g/dL — ABNORMAL HIGH (ref 6.5–8.1)
Total Protein: 8.6 g/dL — ABNORMAL HIGH (ref 6.5–8.1)

## 2023-09-30 LAB — BASIC METABOLIC PANEL WITH GFR
Anion gap: 11 (ref 5–15)
BUN: 27 mg/dL — ABNORMAL HIGH (ref 6–20)
CO2: 26 mmol/L (ref 22–32)
Calcium: 8.7 mg/dL — ABNORMAL LOW (ref 8.9–10.3)
Chloride: 100 mmol/L (ref 98–111)
Creatinine, Ser: 1.09 mg/dL — ABNORMAL HIGH (ref 0.44–1.00)
GFR, Estimated: 60 mL/min — ABNORMAL LOW (ref >60)
Glucose, Bld: 135 mg/dL — ABNORMAL HIGH (ref 70–99)
Potassium: 3.3 mmol/L — ABNORMAL LOW (ref 3.5–5.1)
Sodium: 137 mmol/L (ref 135–145)

## 2023-09-30 LAB — CBC
HCT: 39.4 % (ref 36.0–46.0)
Hemoglobin: 13.1 g/dL (ref 12.0–15.0)
MCH: 27.7 pg (ref 26.0–34.0)
MCHC: 33.2 g/dL (ref 30.0–36.0)
MCV: 83.3 fL (ref 80.0–100.0)
Platelets: 365 10*3/uL (ref 150–400)
RBC: 4.73 MIL/uL (ref 3.87–5.11)
RDW: 11.9 % (ref 11.5–15.5)
WBC: 14 10*3/uL — ABNORMAL HIGH (ref 4.0–10.5)
nRBC: 0 % (ref 0.0–0.2)

## 2023-09-30 LAB — CBC WITH DIFFERENTIAL/PLATELET
Abs Immature Granulocytes: 0.06 10*3/uL (ref 0.00–0.07)
Basophils Absolute: 0 10*3/uL (ref 0.0–0.1)
Basophils Relative: 0 %
Eosinophils Absolute: 0 10*3/uL (ref 0.0–0.5)
Eosinophils Relative: 0 %
HCT: 37.6 % (ref 36.0–46.0)
Hemoglobin: 12.6 g/dL (ref 12.0–15.0)
Immature Granulocytes: 1 %
Lymphocytes Relative: 14 %
Lymphs Abs: 1.8 10*3/uL (ref 0.7–4.0)
MCH: 27.1 pg (ref 26.0–34.0)
MCHC: 33.5 g/dL (ref 30.0–36.0)
MCV: 80.9 fL (ref 80.0–100.0)
Monocytes Absolute: 0.7 10*3/uL (ref 0.1–1.0)
Monocytes Relative: 6 %
Neutro Abs: 10.2 10*3/uL — ABNORMAL HIGH (ref 1.7–7.7)
Neutrophils Relative %: 79 %
Platelets: 362 10*3/uL (ref 150–400)
RBC: 4.65 MIL/uL (ref 3.87–5.11)
RDW: 12.2 % (ref 11.5–15.5)
WBC: 12.8 10*3/uL — ABNORMAL HIGH (ref 4.0–10.5)
nRBC: 0 % (ref 0.0–0.2)

## 2023-09-30 LAB — LIPASE, BLOOD: Lipase: 21 U/L (ref 11–51)

## 2023-09-30 MED ORDER — ONDANSETRON 4 MG PO TBDP: 4.0000 mg | ORAL_TABLET | Freq: Three times a day (TID) | ORAL | 0 refills | Status: DC | PRN

## 2023-09-30 MED ORDER — ONDANSETRON 4 MG PO TBDP
4.0000 mg | ORAL_TABLET | Freq: Once | ORAL | Status: AC
  Administered 2023-09-30: 4 mg via ORAL

## 2023-09-30 MED ORDER — INSULIN ASPART 100 UNIT/ML IJ SOLN
10.0000 [IU] | Freq: Once | INTRAMUSCULAR | Status: AC
  Administered 2023-09-30: 10 [IU] via SUBCUTANEOUS
  Filled 2023-09-30: qty 1

## 2023-09-30 MED ORDER — SODIUM CHLORIDE 0.9 % IV BOLUS
2000.0000 mL | Freq: Once | INTRAVENOUS | Status: AC
  Administered 2023-09-30: 2000 mL via INTRAVENOUS

## 2023-09-30 MED ORDER — SODIUM CHLORIDE 0.9 % IV BOLUS: 1000.0000 mL | Freq: Once | INTRAVENOUS | Status: DC

## 2023-09-30 MED ORDER — CEPHALEXIN 500 MG PO CAPS
500.0000 mg | ORAL_CAPSULE | Freq: Three times a day (TID) | ORAL | 0 refills | Status: AC
Start: 2023-09-30 — End: 2023-10-04

## 2023-09-30 MED ORDER — ONDANSETRON HCL 4 MG/2ML IJ SOLN
4.0000 mg | Freq: Once | INTRAMUSCULAR | Status: AC
  Administered 2023-09-30: 4 mg via INTRAVENOUS
  Filled 2023-09-30: qty 2

## 2023-09-30 MED ORDER — SODIUM CHLORIDE 0.9 % IV SOLN
1.0000 g | Freq: Once | INTRAVENOUS | Status: AC
  Administered 2023-09-30: 1 g via INTRAVENOUS
  Filled 2023-09-30: qty 10

## 2023-09-30 NOTE — ED Provider Notes (Signed)
 [facility] Provider Note    Event Date/Time   First MD Initiated Contact with Patient 09/30/23 1604     (approximate)   History   No chief complaint on file.   HPI  Paula Mcmahon is a 58 y.o. female who presents to the ED for evaluation of No chief complaint on file.   Reviewed urgent care visit from earlier today.  History of DM, HLD, chronic pain syndrome and anemia.  Seen for nausea vomiting abdominal cramping for a few days.  Blood work with an AKI and mild hyponatremia from the urgent care and she is sent to the ED. hyperglycemia glucose 371 and anion gap of 16.  UA with ketonuria, small leukocytes and many bacteria.  Patient presents with family for evaluation of being sick for the past 3 to 4 weeks.  Reports which that was a cold or URI with a cough that seemed to improve, then the cough got worse about 1 week ago.  Reports a productive cough that is more mild now and seems to have again mildly improved.  3 or 4 days ago she developed nausea and vomiting alongside multiple people in the house are sick with the same.  No abdominal pain, stool or urinary changes.  No dysuria, frequency, incontinence, sensation of incomplete emptying or other urinary changes.  Family reports that she looked quite fatigued and unwell this morning but is already perked up at the time I see her with some IV fluids.  Patient reports some mild nausea but overall feeling better.   Physical Exam   Triage Vital Signs: ED Triage Vitals [09/30/23 1400]  Encounter Vitals Group     BP 127/84     Systolic BP Percentile      Diastolic BP Percentile      Pulse Rate 89     Resp 20     Temp 97.8 F (36.6 C)     Temp Source Oral     SpO2 100 %     Weight      Height      Head Circumference      Peak Flow      Pain Score 0     Pain Loc      Pain Education      Exclude from Growth Chart     Most recent vital signs: Vitals:   09/30/23 1939 09/30/23 2018  BP: 137/80    Pulse: 91   Resp: 16   Temp:  97.7 F (36.5 C)  SpO2: 100%     General: Awake, no distress.  CV:  Good peripheral perfusion.  Resp:  Normal effort.  No wheezing, mild cough is noted Abd:  No distention.  Soft and benign throughout without tenderness, guarding or peritoneal features MSK:  No deformity noted.  Neuro:  No focal deficits appreciated. Other:     ED Results / Procedures / Treatments   Labs (all labs ordered are listed, but only abnormal results are displayed) Labs Reviewed  COMPREHENSIVE METABOLIC PANEL WITH GFR - Abnormal; Notable for the following components:      Result Value   Sodium 133 (*)    Chloride 94 (*)    Glucose, Bld 316 (*)    BUN 33 (*)    Creatinine, Ser 1.55 (*)    Total Protein 8.6 (*)    GFR, Estimated 39 (*)    Anion gap 16 (*)    All other components within normal limits  CBC -  Abnormal; Notable for the following components:   WBC 14.0 (*)    All other components within normal limits  URINALYSIS, ROUTINE W REFLEX MICROSCOPIC - Abnormal; Notable for the following components:   Color, Urine YELLOW (*)    APPearance HAZY (*)    Glucose, UA >=500 (*)    Ketones, ur 5 (*)    Leukocytes,Ua LARGE (*)    Bacteria, UA RARE (*)    Non Squamous Epithelial PRESENT (*)    All other components within normal limits  BASIC METABOLIC PANEL WITH GFR - Abnormal; Notable for the following components:   Potassium 3.3 (*)    Glucose, Bld 135 (*)    BUN 27 (*)    Creatinine, Ser 1.09 (*)    Calcium  8.7 (*)    GFR, Estimated 60 (*)    All other components within normal limits  URINE CULTURE  LIPASE, BLOOD    EKG Sinus rhythm with a rate of 87 bpm.  Normal axis and intervals.  QTc borderline at 488.  No clear signs of acute ischemia.  No comparison.  RADIOLOGY CXR interpreted by me without evidence of acute cardiopulmonary pathology.  Official radiology report(s): DG Chest 2 View Result Date: 09/30/2023 CLINICAL DATA:  Cough, nausea and  vomiting for 4 days EXAM: CHEST - 2 VIEW COMPARISON:  None Available. FINDINGS: The heart size and mediastinal contours are within normal limits. Both lungs are clear. The visualized skeletal structures are unremarkable. IMPRESSION: No active cardiopulmonary disease. Electronically Signed   By: Bobbye Burrow M.D.   On: 09/30/2023 18:47    PROCEDURES and INTERVENTIONS:  .1-3 Lead EKG Interpretation  Performed by: Arline Bennett, MD Authorized by: Arline Bennett, MD     Interpretation: normal     ECG rate:  90   ECG rate assessment: normal     Rhythm: sinus rhythm     Ectopy: none     Conduction: normal   .Critical Care  Performed by: Arline Bennett, MD Authorized by: Arline Bennett, MD   Critical care provider statement:    Critical care time (minutes):  30   Critical care time was exclusive of:  Separately billable procedures and treating other patients   Critical care was necessary to treat or prevent imminent or life-threatening deterioration of the following conditions:  Dehydration, endocrine crisis and metabolic crisis   Critical care was time spent personally by me on the following activities:  Development of treatment plan with patient or surrogate, discussions with consultants, evaluation of patient's response to treatment, examination of patient, ordering and review of laboratory studies, ordering and review of radiographic studies, ordering and performing treatments and interventions, pulse oximetry, re-evaluation of patient's condition and review of old charts   Medications  sodium chloride  0.9 % bolus 2,000 mL (0 mLs Intravenous Stopped 09/30/23 1859)  insulin aspart (novoLOG) injection 10 Units (10 Units Subcutaneous Given 09/30/23 1624)  ondansetron  (ZOFRAN ) injection 4 mg (4 mg Intravenous Given 09/30/23 1748)  cefTRIAXone (ROCEPHIN) 1 g in sodium chloride  0.9 % 100 mL IVPB (0 g Intravenous Stopped 09/30/23 2045)     IMPRESSION / MDM / ASSESSMENT AND PLAN / ED COURSE  I  reviewed the triage vital signs and the nursing notes.  Differential diagnosis includes, but is not limited to, DKA, HHS, AKI, gastroenteritis, appendicitis, pneumonia, URI  {Patient presents with symptoms of an acute illness or injury that is potentially life-threatening.  Diabetic patient presents with signs of an AKI, nausea and vomiting possibly due to a  UTI, ultimately suitable for trial of outpatient management.  Appears dry but well.  Borderline tachycardia but otherwise normal vital signs.  Leukocytosis is noted.  AKI, hyperglycemia and small anion gap of 16 is noted.  This gap closes with subcutaneous insulin and IV fluids.  Subsequently looking well, tolerating p.o. intake.   Clear CXR.  I certainly considered admission for this patient but she improves dramatically, repeat BMP with significantly improved renal function and gap closure.    Clinical Course as of 09/30/23 2108  Mon Sep 30, 2023  2048 Repeat metabolic panel with significantly improved creatinine, closure of anion gap.  Urine concerning for infection with non-squamous epithelial, bacteria, large leukocytes.  Sent for culture and she was started on antibiotics. [DS]  2104 Reassessed.  Feeling well.  Discussed reassuring BMP improvement and signs of UTI.  Discussed plan of care and she is comfortable going home.  Discussed antibiotics, antiemetics, expectant management and ED return precautions. [DS]    Clinical Course User Index [DS] Arline Bennett, MD     FINAL CLINICAL IMPRESSION(S) / ED DIAGNOSES   Final diagnoses:  Acute cystitis without hematuria  Hyperglycemia  Dehydration  AKI (acute kidney injury) (HCC)     Rx / DC Orders   ED Discharge Orders          Ordered    ondansetron  (ZOFRAN -ODT) 4 MG disintegrating tablet  Every 8 hours PRN        09/30/23 2105    cephALEXin (KEFLEX) 500 MG capsule  3 times daily        09/30/23 2105             Note:  This document was prepared using Dragon voice  recognition software and may include unintentional dictation errors.   Arline Bennett, MD 09/30/23 2108

## 2023-09-30 NOTE — Discharge Instructions (Signed)
 Please go to the emergency department at [facility] to be evaluated for your low sodium and your acute kidney injury.  Please go now.

## 2023-09-30 NOTE — ED Triage Notes (Addendum)
 Pt c/o abd cramping & N/V x4 days. Had multiple episodes of emesis today. States unable to keep any foods or fluids down. Was around family who had similar sx's.

## 2023-09-30 NOTE — ED Triage Notes (Addendum)
 Pt to ED via POV from UC. Pt reports abd cramping and N/V x4 days. Pt seen at [facility] and had blood work done. Pt sent to ED due to  Na+ 132 and kidney function being elevated and + UTI.

## 2023-09-30 NOTE — ED Provider Notes (Signed)
 [facility]    CSN: [id number] Arrival date & time: 09/30/23  1211      History   Chief Complaint Chief Complaint  Patient presents with   Emesis   Nausea    HPI Paula Mcmahon is a 58 y.o. female.   HPI  58 year old female past medical history difficult for type II diabetes, anemia, vitamin D  deficiency, hyperlipidemia, and chronic pain syndrome presents for evaluation of abdominal cramping with nausea and vomiting that been present for the last 4 days.  She denies any diarrhea.  She also denies fever, blood in her emesis or stool.  She reports that she has been around other family members with similar symptoms.  She also reports that she has not felt well the entire month of April and does endorse some mild runny nose, nasal congestion, postnasal drip, and infrequent, nonproductive cough.  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    Shoulder disorder    frozen left shoulder   Vitamin D  deficiency     Patient Active Problem List   Diagnosis Date Noted   Genetic testing 07/17/2019   Family history of breast cancer    Family history of colon cancer    Family history of lung cancer    Family history of BRCA2 gene positive    Neuropathic pain of left hand 02/18/2015   Pain provoked by trauma 02/18/2015   Diabetes type 2, controlled (HCC) 12/29/2014   Screening for breast cancer 12/29/2014   Routine general medical examination at a health care facility 12/29/2014   Chronic pain syndrome 12/29/2014   Elbow fracture 07/23/2014    Past Surgical History:  Procedure  Laterality Date   BREAST BIOPSY Left 01/28/2015   negative, Dr Arnett Bi office   BREAST BIOPSY Left 03/10/2015   Procedure: BREAST BIOPSY WITH NEEDLE LOCALIZATION;  Surgeon: Jerlean Mood, MD;  Location: [facility] ORS;  Service: General;  Laterality: Left;   BREAST BIOPSY Right 01/30/2016   benign, Dr Marquita Situ Alycia Babcock   BREAST EXCISIONAL BIOPSY Left 03/10/2015   CHOLECYSTECTOMY  1998   EXTERNAL FIXATION ARM  07/2014   left arm    IRRIGATION AND DEBRIDEMENT SEBACEOUS CYST  1988   Right Shoulder    OB History   No obstetric history on file.      Home Medications    Prior to Admission medications   Medication Sig Start Date End Date Taking? Authorizing Provider  Cholecalciferol (VITAMIN D ) 2000 UNITS tablet Take 2,000 Units by mouth daily.   Yes [provider]  glipiZIDE  (GLUCOTROL ) 5 MG tablet Take 5 mg by mouth. 01/12/15  Yes [provider]  JANUVIA  25 MG tablet TAKE 1 TABLET (25 MG TOTAL) BY MOUTH DAILY. 05/16/15  Yes Thomes Flicker, MD  metFORMIN  (GLUCOPHAGE ) 500 MG tablet TAKE 1 TABLET (500 MG TOTAL) BY MOUTH 2 (TWO) TIMES DAILY. 05/09/15  Yes Thomes Flicker, MD  acetaminophen  (TYLENOL ) 500 MG tablet Take by mouth. 07/08/14   [provider]    Family History Family History  Problem Relation Age of Onset   Cancer Maternal Grandmother    Diabetes Maternal Grandmother    Breast cancer Maternal Grandmother        dx 88s   Hyperlipidemia Father    Diabetes Sister    Diabetes Brother        Juvenile diabetes   Colon cancer Paternal Aunt        dx 45s, d. 3s   Lung cancer Maternal Grandfather    Cancer Paternal Grandmother        d. 71s, unk type   Cancer Paternal Grandfather        unk type    Social History Social History   Tobacco Use   Smoking status: Never   Smokeless tobacco: Never  Substance Use Topics   Alcohol use: No    Alcohol/week: 0.0 standard drinks of alcohol   Drug use: No     Allergies   Patient has no  known allergies.   Review of Systems Review of Systems  Constitutional:  Negative for fever.  HENT:  Positive for congestion and rhinorrhea. Negative for ear pain and sore throat.   Respiratory:  Positive for cough.   Gastrointestinal:  Positive for abdominal pain, nausea and vomiting. Negative for blood in stool and diarrhea.     Physical Exam Triage Vital Signs ED Triage Vitals [09/30/23 1216]  Encounter Vitals Group     BP      Systolic BP Percentile      Diastolic BP Percentile      Pulse      Resp 16     Temp      Temp Source Oral     SpO2      Weight      Height      Head Circumference      Peak Flow      Pain Score      Pain Loc      Pain Education      Exclude from Growth Chart    Orthostatic VS for the past 24 hrs:  BP- Lying Pulse- Lying BP- Sitting Pulse- Sitting  09/30/23 1251 116/79 82 108/74 82    Updated Vital Signs BP 125/81 (BP Location: Left Arm)   Pulse 86   Temp 97.7 F (36.5 C) (Oral)   Resp 16   Ht 5\' 3"  (1.6 m)   Wt 120 lb (54.4 kg)   LMP 12/30/2017 Comment: not preg per pt  SpO2 100%   BMI 21.26 kg/m   Visual Acuity Right Eye Distance:   Left Eye Distance:   Bilateral Distance:    Right Eye Near:   Left Eye Near:    Bilateral Near:     Physical Exam Vitals and nursing note reviewed.  Constitutional:      Appearance: Normal appearance. She is not ill-appearing.  HENT:     Head: Normocephalic and atraumatic.     Nose: Nose normal. No congestion or rhinorrhea.     Mouth/Throat:     Mouth: Mucous membranes are moist.     Pharynx: Oropharynx is clear. No oropharyngeal exudate or posterior oropharyngeal erythema.  Cardiovascular:     Rate and Rhythm: Normal rate and regular rhythm.     Pulses: Normal pulses.     Heart sounds: Normal heart sounds. No murmur heard.    No friction rub. No gallop.  Pulmonary:     Effort: Pulmonary effort is normal.  Breath sounds: Normal breath sounds. No wheezing, rhonchi or rales.   Abdominal:     General: Abdomen is flat.     Palpations: Abdomen is soft.     Tenderness: There is abdominal tenderness. There is no guarding or rebound.  Musculoskeletal:     Cervical back: Normal range of motion and neck supple. No tenderness.  Lymphadenopathy:     Cervical: No cervical adenopathy.  Skin:    General: Skin is warm and dry.     Capillary Refill: Capillary refill takes less than 2 seconds.     Findings: No rash.  Neurological:     General: No focal deficit present.     Mental Status: She is alert and oriented to person, place, and time.      UC Treatments / Results  Labs (all labs ordered are listed, but only abnormal results are displayed) Labs Reviewed  CBC WITH DIFFERENTIAL/PLATELET - Abnormal; Notable for the following components:      Result Value   WBC 12.8 (*)    Neutro Abs 10.2 (*)    All other components within normal limits  COMPREHENSIVE METABOLIC PANEL WITH GFR - Abnormal; Notable for the following components:   Sodium 132 (*)    Chloride 91 (*)    Glucose, Bld 371 (*)    BUN 33 (*)    Creatinine, Ser 1.57 (*)    Total Protein 8.9 (*)    GFR, Estimated 39 (*)    Anion gap 16 (*)    All other components within normal limits  URINALYSIS, W/ REFLEX TO CULTURE (INFECTION SUSPECTED)    EKG   Radiology No results found.  Procedures Procedures (including critical care time)  Medications Ordered in UC Medications  ondansetron  (ZOFRAN -ODT) disintegrating tablet 4 mg (4 mg Oral Given 09/30/23 1251)    Initial Impression / Assessment and Plan / UC Course  I have reviewed the triage vital signs and the nursing notes.  Pertinent labs & imaging results that were available during my care of the patient were reviewed by me and considered in my medical decision making (see chart for details).   Patient is a pleasant, nontoxic-appearing 58 year old female presenting for evaluation of GI complaints as outlined HPI above.  Her vital signs are  reassuring as she is afebrile with oral temp of 97.7, heart rate is 86, respirate 16, BP 125/81, with 100% oxygen saturation on room air.  The patient reports that she has been craving and drinking water but has been unable to keep the water down.  On exam her sclera are bright and shiny and her oral mucous membranes are mildly sticky.  Skin turgor is normal.  Cardiopulmonary exam is benign.  Abdomen is soft and flat with diffuse abdominal tenderness but no focal findings.  No guarding or rebound.  I suspect the patient is experiencing a viral GI illness.  Given that she reports that she has not been able to keep down any fluids, coupled with her diabetes, I will order a CMP to evaluate for the presence of any electrolyte abnormality or shift in acid-base balance.  I will also order a CBC to evaluate the patient's ongoing anemia and to evaluate for any systemic infection.  Urinalysis to assess specific gravity.  I have ordered 4 mg of p.o. Zofran  to be followed 20 minutes later by a p.o. fluid challenge to see if patient can take in fluids by mouth and keep them down.  I will also have staff check orthostatic vital  signs to determine any fluid deficit the patient may be experiencing.  CBC shows an elevated white count of 12.8.  H&H is normal at 12.6 and 37.6 respectively.  Platelets normal at 362.  CMP shows mild hyponatremia with a sodium of 132.  Potassium is normal at 4.4.  Chloride is mildly low at 91.  Glucose is 371 and patient has an AKI with a BUN of 33 and creatinine 1.57.  Only available blood work is from 8 years ago in the system with no more recent renal function available.  At that time BUN was 7 with a creatinine of 0.65.  I will refer the patient to the emergency department for hyponatremia and acute kidney injury.  He is elected to go to [facility] via POV.   Final Clinical Impressions(s) / UC Diagnoses   Final diagnoses:  Hyponatremia  Acute kidney injury [facility])     Discharge Instructions       Please go to the emergency department at [facility] to be evaluated for your low sodium and your acute kidney injury.  Please go now.     ED Prescriptions   None    PDMP not reviewed this encounter.   Kent Pear, NP 09/30/23 1329

## 2023-09-30 NOTE — Discharge Instructions (Signed)
 As we discussed, 4 more days of antibiotics for a total of 5 days for your urine.  Keflex antibiotic 3 times daily.  The IV antibiotics we gave you in the ED last about 24 hours, so take 1 dose of Keflex in the evening tomorrow, start 3 times daily the following day.  Zofran  as needed for any further nausea or vomiting  Continue your diabetes medications  Return to the ED with any worsening symptoms despite these measures

## 2023-09-30 NOTE — ED Notes (Signed)
 Patient is being discharged from the Urgent Care and sent to the Emergency Department via POV . Per Kent Pear NP, patient is in need of higher level of care due to AKI & Hyponatremia. Patient is aware and verbalizes understanding of plan of care.  Vitals:   09/30/23 1216  BP: 125/81  Pulse: 86  Resp: 16  Temp: 97.7 F (36.5 C)  SpO2: 100%

## 2023-10-01 ENCOUNTER — Ambulatory Visit: Payer: Self-pay

## 2023-10-01 ENCOUNTER — Ambulatory Visit: Admitting: Family Medicine

## 2023-10-01 VITALS — BP 136/86 | HR 81 | Temp 97.9°F | Resp 16 | Ht 62.99 in | Wt 124.4 lb

## 2023-10-01 DIAGNOSIS — E1165 Type 2 diabetes mellitus with hyperglycemia: Secondary | ICD-10-CM | POA: Diagnosis not present

## 2023-10-01 DIAGNOSIS — Z7689 Persons encountering health services in other specified circumstances: Secondary | ICD-10-CM | POA: Diagnosis not present

## 2023-10-01 DIAGNOSIS — E876 Hypokalemia: Secondary | ICD-10-CM | POA: Diagnosis not present

## 2023-10-01 DIAGNOSIS — Z7984 Long term (current) use of oral hypoglycemic drugs: Secondary | ICD-10-CM | POA: Diagnosis not present

## 2023-10-01 DIAGNOSIS — E559 Vitamin D deficiency, unspecified: Secondary | ICD-10-CM

## 2023-10-01 LAB — URINE CULTURE: Culture: <10000 — AB

## 2023-10-01 LAB — POCT GLYCOSYLATED HEMOGLOBIN (HGB A1C): Hemoglobin A1C: 10.6 % — AB (ref 4.0–5.6)

## 2023-10-01 MED ORDER — VITAMIN D 50 MCG (2000 UT) PO TABS: 2000.0000 [IU] | ORAL_TABLET | Freq: Every day | ORAL | 2 refills | Status: DC

## 2023-10-01 MED ORDER — POTASSIUM CHLORIDE CRYS ER 10 MEQ PO TBCR
20.0000 meq | EXTENDED_RELEASE_TABLET | Freq: Once | ORAL | 0 refills | Status: DC
Start: 2023-10-01 — End: 2023-10-15

## 2023-10-01 MED ORDER — DEXCOM G6 TRANSMITTER MISC: 1.0000 | Freq: Once | 99 refills | Status: AC

## 2023-10-01 MED ORDER — METFORMIN HCL 500 MG PO TABS: 500.0000 mg | ORAL_TABLET | Freq: Two times a day (BID) | ORAL | 2 refills | Status: DC

## 2023-10-01 MED ORDER — DEXCOM G6 RECEIVER DEVI: 1.0000 | Freq: Once | 99 refills | Status: AC

## 2023-10-01 MED ORDER — DEXCOM G6 SENSOR MISC: 3 refills | Status: DC

## 2023-10-01 MED ORDER — GLIPIZIDE 5 MG PO TABS: 5.0000 mg | ORAL_TABLET | Freq: Every day | ORAL | 2 refills | Status: DC

## 2023-10-01 MED ORDER — SITAGLIPTIN PHOSPHATE 25 MG PO TABS: 25.0000 mg | ORAL_TABLET | Freq: Every day | ORAL | 2 refills | Status: DC

## 2023-10-01 NOTE — Patient Instructions (Signed)

## 2023-10-01 NOTE — Telephone Encounter (Signed)
 Summary: Vomiting   Copied From CRM [id number]. Reason for Triage: Mother calling on behalf of patient, pt has vomited again last night after leaving ED and has continued to vomit. Requesting new patient appointment/hospital f/u at [facility].  Please reach out to patient, [phone]  Chief Complaint: Vomiting Symptoms: Abdominal cramping, slight weakness Frequency: Since Friday Pertinent Negatives: Patient denies fever Disposition: [] ED /[] Urgent Care (no appt availability in office) / [x] Appointment(In office/virtual)/ []  [facility]/ [] Home Care/ [] Refused Recommended Disposition /[] [facility]/ []  Follow-up with PCP Additional Notes: Patient was seen in the ED yesterday for vomiting. Patient stated she vomited several more times when she got home last night, but stated she has not vomited since 12:30 am. Patient stated her symptoms have not worsened or changed since ED visit. Patient is also looking to establish care at this time. Patient requested to establish care at [facility]. Scheduled NP appointment for this afternoon. Provided care advice and instructed patient to call back if symptoms worsen. Patient complied.   Reason for Disposition  [1] MILD or MODERATE vomiting AND [2] present > 48 hours (2 days) (Exception: Mild vomiting with associated diarrhea.)  Answer Assessment - Initial Assessment Questions 1. VOMITING SEVERITY: "How many times have you vomited in the past 24 hours?"     - MILD:  1 - 2 times/day    - MODERATE: 3 - 5 times/day, decreased oral intake without significant weight loss or symptoms of dehydration    - SEVERE: 6 or more times/day, vomits everything or nearly everything, with significant weight loss, symptoms of dehydration      States she threw up 15 times since getting home last night, has not thrown-up since 12:30am  2. ONSET: "When did the vomiting begin?"      Last Friday 3. FLUIDS: "What fluids or food have you vomited  up today?" "Have you been able to keep any fluids down?"     States she has been able to keep water down 4. ABDOMEN PAIN: "Are your having any abdomen pain?" If Yes : "How bad is it and what does it feel like?" (e.g., crampy, dull, intermittent, constant)      Mild abdominal pain, states stomach feels crampy after eating or drinking 5. DIARRHEA: "Is there any diarrhea?" If Yes, ask: "How many times today?"      Denies  7. CAUSE: "What do you think is causing your vomiting?"     Treated in ED for low sodium and UTI 8. HYDRATION STATUS: "Any signs of dehydration?" (e.g., dry mouth [not only dry lips], too weak to stand) "When did you last urinate?"     States she feels "a little weak" due to lack of food 9. OTHER SYMPTOMS: "Do you have any other symptoms?" (e.g., fever, headache, vertigo, vomiting blood or coffee grounds, recent head injury)     Denies fever, slight headache  Protocols used: Vomiting-A-AH

## 2023-10-01 NOTE — Progress Notes (Signed)
 New Patient Office Visit  Subjective   Patient ID: Paula Mcmahon, female    DOB: Mar 28, 1966  Age: 58 y.o. MRN: [id number]  CC:  Chief Complaint  Patient presents with   Establish Care    Pt. Here to establish care.    Emesis    Pt. Was seen at the UC on 09/30/2023 but was seen at the ER the same day due to vomiting, UTI. Pt. Stated when she made it home she kept vomiting and feeling nausea. Pt. Denies pain.    Medication Refill    Pt. Stated that she was out of ALL of her medication. Need refills.     HPI Paula Mcmahon is a 58 year old female who presents to establish with [facility] at [facility].   CC: Patient here to establish care  Last PCP: [facility] (?) Specialists: orthopedics/PT  She presents today for an acute visit with complaint of nausea/vomiting and abdominal cramping for the past few days.  Review of chart- patient was seen at [facility] with signs of AKI, N/V possibly d/t UTI with borderline tachycardia, leukocytosis, and hyperglycemia. She was sent home with antibiotics and antiemetics. She reports she has not picked up the medications prescribed by [facility] due to them not being ready. Plans to start once she leaves this appt.  Today, she reports that she has not thrown up since leaving [facility]. She reports lack of appetite and nausea, but has been wable to keep water and ginger ale down.  Reports fatigue and diarrhea. Denies fever/chills, abdominal pain, urinary symptoms, polyuria, polydipsia and polyphagia.  Blood sugar fasting was 175 this AM She has not been taking any of her diabetic meds for the past 2 months   Outpatient Encounter Medications as of 10/01/2023  Medication Sig   [EXPIRED] cephALEXin  (KEFLEX ) 500 MG capsule Take 1 capsule (500 mg total) by mouth 3 (three) times daily for 4 days.   [EXPIRED] Continuous Glucose Receiver (DEXCOM G6 RECEIVER) DEVI 1 each by Does not apply route once for 1 dose.   Continuous Glucose Sensor (DEXCOM G6  SENSOR) MISC Change sensor every 10 days.   [EXPIRED] Continuous Glucose Transmitter (DEXCOM G6 TRANSMITTER) MISC 1 each by Does not apply route once for 1 dose.   ondansetron  (ZOFRAN -ODT) 4 MG disintegrating tablet Take 1 tablet (4 mg total) by mouth every 8 (eight) hours as needed.   potassium chloride  (KLOR-CON  M) 10 MEQ tablet Take 2 tablets (20 mEq total) by mouth once for 1 dose.   [DISCONTINUED] Cholecalciferol (VITAMIN D ) 2000 UNITS tablet Take 2,000 Units by mouth daily.   [DISCONTINUED] glipiZIDE  (GLUCOTROL ) 5 MG tablet Take 5 mg by mouth.   [DISCONTINUED] JANUVIA  25 MG tablet TAKE 1 TABLET (25 MG TOTAL) BY MOUTH DAILY.   [DISCONTINUED] metFORMIN  (GLUCOPHAGE ) 500 MG tablet TAKE 1 TABLET (500 MG TOTAL) BY MOUTH 2 (TWO) TIMES DAILY.   Cholecalciferol (VITAMIN D ) 50 MCG (2000 UT) tablet Take 1 tablet (2,000 Units total) by mouth daily.   glipiZIDE  (GLUCOTROL ) 5 MG tablet Take 1 tablet (5 mg total) by mouth daily before breakfast.   metFORMIN  (GLUCOPHAGE ) 500 MG tablet Take 1 tablet (500 mg total) by mouth 2 (two) times daily.   sitaGLIPtin  (JANUVIA ) 25 MG tablet Take 1 tablet (25 mg total) by mouth daily.   [DISCONTINUED] acetaminophen  (TYLENOL ) 500 MG tablet Take by mouth.   No facility-administered encounter medications on file as of 10/01/2023.    Patient Active Problem List   Diagnosis  Date Noted   Genetic testing 07/17/2019   Family history of breast cancer    Family history of colon cancer    Family history of lung cancer    Family history of BRCA2 gene positive    Neuropathic pain of left hand 02/18/2015   Pain provoked by trauma 02/18/2015   Diabetes type 2, controlled (HCC) 12/29/2014   Screening for breast cancer 12/29/2014   Routine general medical examination at a health care facility 12/29/2014   Chronic pain syndrome 12/29/2014   Elbow fracture 07/23/2014   Past Medical History:  Diagnosis Date   Anemia    H/O PREGNANCY   Diabetes mellitus without complication  (HCC)    Family history of BRCA2 gene positive    Family history of breast cancer    Family history of colon cancer    Family history of lung cancer    Fracture of arm, left, multiple, closed    Frozen shoulder 2016   LEFT   Gum disease    Hyperlipidemia    Neuromuscular disorder (HCC) 06-2014   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    Shoulder disorder    frozen left shoulder   Vitamin D  deficiency    Past Surgical History:  Procedure Laterality Date   BREAST BIOPSY Left 01/28/2015   negative, Dr Arnett Bi office   BREAST BIOPSY Left 03/10/2015   Procedure: BREAST BIOPSY WITH NEEDLE LOCALIZATION;  Surgeon: Jerlean Mood, MD;  Location: [facility] ORS;  Service: General;  Laterality: Left;   BREAST BIOPSY Right 01/30/2016   benign, Dr Marquita Situ Alycia Babcock   BREAST EXCISIONAL BIOPSY Left 03/10/2015   CHOLECYSTECTOMY  1998   EXTERNAL FIXATION ARM  07/2014   left arm    IRRIGATION AND DEBRIDEMENT SEBACEOUS CYST  1988   Right Shoulder   Family History  Problem Relation Age of Onset   Cancer Maternal Grandmother    Diabetes Maternal Grandmother    Breast cancer Maternal Grandmother        dx 48s   Hyperlipidemia Father    Diabetes Sister    Diabetes Brother        Juvenile diabetes   Colon cancer Paternal Aunt        dx 45s, d. 90s   Lung cancer Maternal Grandfather    Cancer Paternal Grandmother        d. 61s, unk type   Cancer Paternal Grandfather        unk type   Social History   Socioeconomic History   Marital status: Divorced    Spouse name: Not on file   Number of children: Not on file   Years of education: Not on file   Highest education level: Not on file  Occupational History   Not on file  Tobacco Use   Smoking status: Never   Smokeless tobacco: Never  Substance and Sexual Activity   Alcohol use: No    Alcohol/week: 0.0 standard drinks of alcohol   Drug use: No   Sexual activity: Not on file  Other Topics Concern   Not on file  Social History Narrative    Lives in [place] with husband and daughters.   Dog and cat in home.      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 12 capsule 0   ondansetron  (ZOFRAN -ODT) 4 MG disintegrating tablet Take 1 tablet (4 mg total) by mouth every 8 (eight) hours as needed. 20 tablet 0   Cholecalciferol (VITAMIN D ) 2000 UNITS tablet Take 2,000 Units by mouth daily.     glipiZIDE  (GLUCOTROL ) 5 MG tablet Take 5 mg by mouth.     JANUVIA  25 MG tablet TAKE 1 TABLET (25 MG TOTAL) BY MOUTH DAILY. 30 tablet 3   metFORMIN  (GLUCOPHAGE ) 500 MG tablet TAKE 1 TABLET (500 MG TOTAL) BY MOUTH 2 (TWO) TIMES DAILY. 60 tablet 3   acetaminophen  (TYLENOL ) 500 MG tablet Take by mouth.     No facility-administered medications prior to visit.   No Known Allergies   ROS: see HPI     Objective   Today's Vitals   10/01/23 1324  BP: 136/86  Pulse: 81  Resp: 16  Temp: 97.9 F (36.6 C)  TempSrc: Oral  SpO2: 97%  Weight: 124 lb 6.4 oz (56.4 kg)  Height: 5' 2.99" (1.6 m)  PainSc: 2   PainLoc: Abdomen    GENERAL: Well-appearing, in NAD. Well nourished.  SKIN: Pink, warm and dry. No rash, lesion, ulceration, or ecchymoses.  Head: Normocephalic. NECK: Trachea midline. Full ROM w/o pain or tenderness. No lymphadenopathy.  EARS: Tympanic membranes are intact, translucent without bulging and without drainage. Appropriate landmarks visualized.  EYES: Conjunctiva clear without exudates. EOMI, PERRL, no drainage present.   NOSE: Septum midline w/o deformity. Nares patent, mucosa pink and non-inflamed w/o drainage. No sinus tenderness.  THROAT: Uvula midline. Oropharynx clear. Tonsils non-inflamed without exudate. Mucous membranes pink and moist.  RESPIRATORY: Chest wall symmetrical. Respirations even and non-labored. Breath sounds clear to auscultation bilaterally.  CARDIAC: S1, S2 present, regular rate and rhythm without murmur or gallops. Peripheral pulses 2+ bilaterally.  MSK: Muscle tone and strength appropriate for age. Joints w/o tenderness, redness, or swelling.  EXTREMITIES: Without clubbing, cyanosis, or edema.  NEUROLOGIC: No motor or sensory deficits. Steady, even gait. C2-C12 intact.  PSYCH/MENTAL STATUS: Alert, oriented x 3. Cooperative, appropriate mood and affect.     Assessment & Plan:   1. Encounter to establish care (Primary) Patient is a 49- year-old female who presents today to establish care with primary care at [facility]. Reviewed the past medical history, family history, social history, surgical history, medications and allergies today- updates made as indicated. Patient has concerns today about her kidney function in relation to recent ongoing nausea and vomiting.    2. Uncontrolled type 2 diabetes mellitus with hyperglycemia (HCC) Patient presents today with ongoing nausea and vomiting. Denies 3P's, open wounds/ulcers on her feet, abdominal pain, vision changes, fatigue, headaches, and dry mouth. POCT A1c performed in office today with results of 10.6%. Discussed importance with medication compliance. Since patient has not been taking her previously prescribed medication regimen, advised her to restart taking glipizide  5mg  daily, Januvia  25mg  daily, and metformin  500mg  BID. Considered starting patient with long-acting nightly insulin - however, will restart oral medications at this time. Rx sent for all medications to the pharmacy on file. Order sent for Dexcom, as patient may be more  compliant with CGM while on insulin . Would like to follow-up with patient in 2 weeks to determine if symptoms of N/V are still present and to assess her fasting blood sugar readings.  - glipiZIDE  (GLUCOTROL ) 5 MG tablet; Take 1 tablet (5 mg total) by mouth daily before breakfast.  Dispense: 30 tablet; Refill: 2 - sitaGLIPtin  (JANUVIA ) 25 MG tablet; Take 1 tablet (25 mg total) by mouth daily.  Dispense: 30 tablet; Refill: 2 - metFORMIN  (GLUCOPHAGE ) 500 MG tablet; Take 1 tablet (500 mg total) by mouth 2 (two) times daily.  Dispense: 60 tablet; Refill: 2 - POCT glycosylated hemoglobin (Hb A1C) - Continuous Glucose Receiver (DEXCOM G6 RECEIVER) DEVI; 1 each by Does not apply route once for 1 dose.  Dispense: 1 each; Refill: PRN - Continuous Glucose Sensor (DEXCOM G6 SENSOR) MISC; Change sensor every 10 days.  Dispense: 1 each; Refill: 3 - Continuous Glucose Transmitter (DEXCOM G6 TRANSMITTER) MISC; 1 each by Does not apply route once for 1 dose.  Dispense: 1 each; Refill: PRN  3. Vitamin D  deficiency Refill provided for vitamin D  supplement.  - Cholecalciferol (VITAMIN D ) 50 MCG (2000 UT) tablet; Take 1 tablet (2,000 Units total) by mouth daily.  Dispense: 30 tablet; Refill: 2  4. Hypokalemia Review of labs performed in ED shows hypokalemia with no potassium supplementation prescribed. Will give one time dose of 20mEq total and will repeat BMP in 2 weeks to see if this has resolved.  - potassium chloride  (KLOR-CON  M) 10 MEQ tablet; Take 2 tablets (20 mEq total) by mouth once for 1 dose.  Dispense: 2 tablet; Refill: 0   Return in about 2 weeks (around 10/15/2023) for Diabetes f/u.   [name], FNP

## 2023-10-02 ENCOUNTER — Other Ambulatory Visit: Payer: Self-pay

## 2023-10-02 ENCOUNTER — Ambulatory Visit: Payer: Self-pay

## 2023-10-02 ENCOUNTER — Emergency Department: Admission: EM | Admit: 2023-10-02 | Discharge: 2023-10-02 | Disposition: A | Discharge status: Home / Self Care

## 2023-10-02 ENCOUNTER — Emergency Department

## 2023-10-02 DIAGNOSIS — R1084 Generalized abdominal pain: Secondary | ICD-10-CM | POA: Insufficient documentation

## 2023-10-02 DIAGNOSIS — E876 Hypokalemia: Secondary | ICD-10-CM | POA: Diagnosis not present

## 2023-10-02 DIAGNOSIS — R112 Nausea with vomiting, unspecified: Secondary | ICD-10-CM | POA: Insufficient documentation

## 2023-10-02 DIAGNOSIS — E871 Hypo-osmolality and hyponatremia: Secondary | ICD-10-CM | POA: Insufficient documentation

## 2023-10-02 DIAGNOSIS — E119 Type 2 diabetes mellitus without complications: Secondary | ICD-10-CM | POA: Diagnosis not present

## 2023-10-02 DIAGNOSIS — R9431 Abnormal electrocardiogram [ECG] [EKG]: Secondary | ICD-10-CM | POA: Diagnosis not present

## 2023-10-02 LAB — COMPREHENSIVE METABOLIC PANEL WITH GFR
ALT: 17 U/L (ref 0–44)
AST: 21 U/L (ref 15–41)
Albumin: 3.8 g/dL (ref 3.5–5.0)
Alkaline Phosphatase: 66 U/L (ref 38–126)
Anion gap: 11 (ref 5–15)
BUN: 20 mg/dL (ref 6–20)
CO2: 25 mmol/L (ref 22–32)
Calcium: 8.9 mg/dL (ref 8.9–10.3)
Chloride: 98 mmol/L (ref 98–111)
Creatinine, Ser: 1.18 mg/dL — ABNORMAL HIGH (ref 0.44–1.00)
GFR, Estimated: 55 mL/min — ABNORMAL LOW (ref >60)
Glucose, Bld: 197 mg/dL — ABNORMAL HIGH (ref 70–99)
Potassium: 3.3 mmol/L — ABNORMAL LOW (ref 3.5–5.1)
Sodium: 134 mmol/L — ABNORMAL LOW (ref 135–145)
Total Bilirubin: 1.2 mg/dL (ref 0.0–1.2)
Total Protein: 7.6 g/dL (ref 6.5–8.1)

## 2023-10-02 LAB — URINALYSIS, ROUTINE W REFLEX MICROSCOPIC
Bacteria, UA: NONE SEEN
Bilirubin Urine: NEGATIVE
Glucose, UA: 50 mg/dL — AB
Ketones, ur: 20 mg/dL — AB
Nitrite: NEGATIVE
Protein, ur: NEGATIVE mg/dL
Specific Gravity, Urine: 1.02 (ref 1.005–1.030)
pH: 5 (ref 5.0–8.0)

## 2023-10-02 LAB — CBC
HCT: 37 % (ref 36.0–46.0)
Hemoglobin: 12.5 g/dL (ref 12.0–15.0)
MCH: 27.7 pg (ref 26.0–34.0)
MCHC: 33.8 g/dL (ref 30.0–36.0)
MCV: 81.9 fL (ref 80.0–100.0)
Platelets: 294 10*3/uL (ref 150–400)
RBC: 4.52 MIL/uL (ref 3.87–5.11)
RDW: 11.9 % (ref 11.5–15.5)
WBC: 8.5 10*3/uL (ref 4.0–10.5)
nRBC: 0 % (ref 0.0–0.2)

## 2023-10-02 LAB — TROPONIN I (HIGH SENSITIVITY): Troponin I (High Sensitivity): 6 ng/L (ref <18)

## 2023-10-02 LAB — LIPASE, BLOOD: Lipase: 24 U/L (ref 11–51)

## 2023-10-02 MED ORDER — DIPHENHYDRAMINE HCL 50 MG/ML IJ SOLN
25.0000 mg | Freq: Once | INTRAMUSCULAR | Status: AC
  Administered 2023-10-02: 25 mg via INTRAVENOUS
  Filled 2023-10-02: qty 1

## 2023-10-02 MED ORDER — METOCLOPRAMIDE HCL 5 MG/ML IJ SOLN
10.0000 mg | Freq: Once | INTRAMUSCULAR | Status: AC
  Administered 2023-10-02: 10 mg via INTRAVENOUS
  Filled 2023-10-02: qty 2

## 2023-10-02 MED ORDER — POTASSIUM CHLORIDE 20 MEQ PO PACK
40.0000 meq | PACK | Freq: Once | ORAL | Status: AC
  Administered 2023-10-02: 40 meq via ORAL
  Filled 2023-10-02: qty 2

## 2023-10-02 MED ORDER — IOHEXOL 300 MG/ML  SOLN
100.0000 mL | Freq: Once | INTRAMUSCULAR | Status: AC | PRN
  Administered 2023-10-02: 100 mL via INTRAVENOUS

## 2023-10-02 MED ORDER — ONDANSETRON HCL 4 MG/2ML IJ SOLN
4.0000 mg | Freq: Once | INTRAMUSCULAR | Status: AC
  Administered 2023-10-02: 4 mg via INTRAVENOUS
  Filled 2023-10-02: qty 2

## 2023-10-02 MED ORDER — SODIUM CHLORIDE 0.9 % IV BOLUS
1000.0000 mL | Freq: Once | INTRAVENOUS | Status: AC
  Administered 2023-10-02: 1000 mL via INTRAVENOUS

## 2023-10-02 MED ORDER — METOCLOPRAMIDE HCL 10 MG PO TABS: 10.0000 mg | ORAL_TABLET | Freq: Three times a day (TID) | ORAL | 0 refills | Status: DC | PRN

## 2023-10-02 NOTE — Discharge Instructions (Addendum)
 Your evaluation in the emergency department was overall reassuring.  I am unsure as to the exact cause of your vomiting, but it improved with treatment here.  I prescribed you a new nausea medication to help with this.  Continue your antibiotics as already prescribed and follow-up with your primary care doctor.  Return to the emergency department with any new or worsening symptoms.

## 2023-10-02 NOTE — ED Triage Notes (Signed)
 Pt to ED via POV from home. Pt was seen on 4/28 for N/V and abd pain. Pt discharged that night and reports has still bee having N/V. Pt reports feeling very dehydrated. Pt reports last BM Friday morning.

## 2023-10-02 NOTE — ED Provider Notes (Signed)
 [facility] Provider Note    Event Date/Time   First MD Initiated Contact with Patient 10/02/23 1208     (approximate)   History   Dehydration (/)  Pt to ED via POV from home. Pt was seen on 4/28 for N/V and abd pain. Pt discharged that night and reports has still bee having N/V. Pt reports feeling very dehydrated. Pt reports last BM Friday morning.    HPI Paula Mcmahon is a 58 y.o. female PMH DM, hyperlipidemia, chronic pain, anemia presents for evaluation of nausea vomiting abdominal discomfort - Summary of recent medical care below - A, patient states she has been vomiting since shortly after leaving the emergency department 2 days ago.  Tolerating little to no p.o. intake.  No bowel movement in about 4 days. - No urinary symptoms - Some vague diffuse abdominal pain as well as right flank discomfort - History of prior cholecystectomy  Per chart review, patient was seen in the emergency department 2 days ago on 09/30/2023, had been seen in urgent care earlier in the day as well.  Blood work with AKI and mild hyponatremia, sent to ED. urinalysis with evidence of infection.  Apparently multiple people in the household recently had GI illness.  Had elevated blood sugar and anion gap though significantly improved after IV fluid and antibiotics, ultimately discharged home.      Physical Exam   Triage Vital Signs: ED Triage Vitals  Encounter Vitals Group     BP 10/02/23 1049 (!) 147/90     Systolic BP Percentile --      Diastolic BP Percentile --      Pulse Rate 10/02/23 1049 88     Resp 10/02/23 1049 20     Temp 10/02/23 1049 97.8 F (36.6 C)     Temp Source 10/02/23 1049 Oral     SpO2 10/02/23 1049 98 %     Weight --      Height --      Head Circumference --      Peak Flow --      Pain Score 10/02/23 1050 6     Pain Loc --      Pain Education --      Exclude from Growth Chart --     Most recent vital signs: Vitals:   10/02/23 1049  BP: (!)  147/90  Pulse: 88  Resp: 20  Temp: 97.8 F (36.6 C)  SpO2: 98%     General: Awake, no distress.  Appears dry. CV:  Good peripheral perfusion. RRR, RP 2+ Resp:  Normal effort. CTAB Abd:  No obvious distention.  Mildly tender to palpation throughout though not peritonitic exam.  Mild right CVA tenderness present.    ED Results / Procedures / Treatments   Labs (all labs ordered are listed, but only abnormal results are displayed) Labs Reviewed  COMPREHENSIVE METABOLIC PANEL WITH GFR - Abnormal; Notable for the following components:      Result Value   Sodium 134 (*)    Potassium 3.3 (*)    Glucose, Bld 197 (*)    Creatinine, Ser 1.18 (*)    GFR, Estimated 55 (*)    All other components within normal limits  URINALYSIS, ROUTINE W REFLEX MICROSCOPIC - Abnormal; Notable for the following components:   Color, Urine STRAW (*)    APPearance CLEAR (*)    Glucose, UA 50 (*)    Hgb urine dipstick SMALL (*)    Ketones, ur  20 (*)    Leukocytes,Ua TRACE (*)    All other components within normal limits  LIPASE, BLOOD  CBC  TROPONIN I (HIGH SENSITIVITY)     EKG  See ED course   RADIOLOGY See ED course below.  Interpreted by myself and radiology report reviewed.    PROCEDURES:  Critical Care performed: No  Procedures   MEDICATIONS ORDERED IN ED: Medications  sodium chloride  0.9 % bolus 1,000 mL (0 mLs Intravenous Stopping previously hung infusion 10/02/23 1439)  ondansetron  (ZOFRAN ) injection 4 mg (4 mg Intravenous Given 10/02/23 1256)  iohexol (OMNIPAQUE) 300 MG/ML solution 100 mL (100 mLs Intravenous Contrast Given 10/02/23 1327)  potassium chloride (KLOR-CON) packet 40 mEq (40 mEq Oral Given 10/02/23 1458)  metoCLOPramide (REGLAN) injection 10 mg (10 mg Intravenous Given 10/02/23 1459)  diphenhydrAMINE (BENADRYL) injection 25 mg (25 mg Intravenous Given 10/02/23 1500)     IMPRESSION / MDM / ASSESSMENT AND PLAN / ED COURSE  I reviewed the triage vital signs and the  nursing notes.                              DDX/MDM/AP: Differential diagnosis includes, but is not limited to, dehydration in the setting of intractable nausea and vomiting.  Suspect likely gastritis in the setting of recent viral syndrome.  Consider bowel obstruction--reasonable to escalate workup to CT abdomen pelvis given recent evaluations and not improving as hoped.  Doubt other acute intra-abdominal pathology including appendicitis, diverticulitis, pancreatitis.  EKG nonischemic, will add troponin to ensure not having an anginal equivalent.  Consider ongoing UTI/pyelonephritis tells me she has been able to tolerate her antibiotics as prescribed.  Plan: - Labs - EKG - CT abdomen pelvis with  -IV fluid, Zofran    Patient's presentation is most consistent with acute presentation with potential threat to life or bodily function.    ED course below.  Workup unremarkable including CT abdomen pelvis.  Patient had ongoing vomiting after Zofran  but felt much better after combination of Reglan and Benadryl.  Suspect some element of gastritis, Rx Reglan and plan for PMD follow-up.  ED return precautions in place.  Patient agrees with plan.  Clinical Course as of 10/02/23 1604  Wed Oct 02, 2023  1238 CMP with improved AKI, mild hyponatremia, stable mild hypokalemia.  Glucose 197, anion gap normal.  CBC unremarkable. [MM]  1252 Ecg = sinus rhythm, rate 87, no ST elevation or depression, no significant repolarization abnormality, normal axis, normal intervals.  No clear evidence of ischemia on my interpretation. [MM]  1426 CTAP: IMPRESSION: 1. No acute intra-abdominal or pelvic pathology. 2.  Aortic Atherosclerosis (ICD10-I70.0).   [MM]  1446 Reevaluated, reassured by unremarkable workup but did vomit again when attempting p.o.  Will trial Reglan and Benadryl and reassess. [MM]  1507 UA not c/w infxn [MM]    Clinical Course User Index [MM] Collis Deaner, MD     FINAL CLINICAL  IMPRESSION(S) / ED DIAGNOSES   Final diagnoses:  Nausea and vomiting, unspecified vomiting type     Rx / DC Orders   ED Discharge Orders          Ordered    metoCLOPramide (REGLAN) 10 MG tablet  Every 8 hours PRN        10/02/23 1602             Note:  This document was prepared using Dragon voice recognition software and may include unintentional dictation errors.  Collis Deaner, MD 10/02/23 [phone]

## 2023-10-02 NOTE — Telephone Encounter (Signed)
 Copied from CRM [id number]. Topic: Clinical - Red Word Triage >> Oct 02, 2023 10:01 AM Paula Mcmahon wrote: Red Word that prompted transfer to Nurse Triage: Vomiting for days, cannot keep anything down. Pt's mother wants to know if she should take her to the ED again    Chief Complaint: Pt. Has vomited 12 times since last night, very weak. Urine is light yellow, not "keeping anything down today."  Symptoms: Above Frequency: Last Friday, seen in ED Monday, seen in office yesterday. Pertinent Negatives: Patient denies  Disposition: [x] ED /[] Urgent Care (no appt availability in office) / [] Appointment(In office/virtual)/ []  [facility]/ [] Home Care/ [] Refused Recommended Disposition /[] [facility]/ []  Follow-up with PCP Additional Notes: Agrees with ED.  Reason for Disposition  [1] SEVERE vomiting (e.g., 6 or more times/day) AND [2] present > 8 hours (Exception: Patient sounds well, is drinking liquids, does not sound dehydrated, and vomiting has lasted less than 24 hours.)  Answer Assessment - Initial Assessment Questions 1. VOMITING SEVERITY: "How many times have you vomited in the past 24 hours?"     - MILD:  1 - 2 times/day    - MODERATE: 3 - 5 times/day, decreased oral intake without significant weight loss or symptoms of dehydration    - SEVERE: 6 or more times/day, vomits everything or nearly everything, with significant weight loss, symptoms of dehydration      Severe 2. ONSET: "When did the vomiting begin?"      Friday 3. FLUIDS: "What fluids or food have you vomited up today?" "Have you been able to keep any fluids down?"     No 4. ABDOMEN PAIN: "Are your having any abdomen pain?" If Yes : "How bad is it and what does it feel like?" (e.g., crampy, dull, intermittent, constant)      Mild 5. DIARRHEA: "Is there any diarrhea?" If Yes, ask: "How many times today?"      No 6. CONTACTS: "Is there anyone else in the family with the same symptoms?"      No 7. CAUSE:  "What do you think is causing your vomiting?"     Unsure 8. HYDRATION STATUS: "Any signs of dehydration?" (e.g., dry mouth [not only dry lips], too weak to stand) "When did you last urinate?"     Urine is light yellow 9. OTHER SYMPTOMS: "Do you have any other symptoms?" (e.g., fever, headache, vertigo, vomiting blood or coffee grounds, recent head injury)     No 10. PREGNANCY: "Is there any chance you are pregnant?" "When was your last menstrual period?"       No  Protocols used: Vomiting-A-AH

## 2023-10-08 ENCOUNTER — Encounter: Payer: Self-pay | Admitting: Family Medicine

## 2023-10-08 DIAGNOSIS — E1165 Type 2 diabetes mellitus with hyperglycemia: Secondary | ICD-10-CM | POA: Insufficient documentation

## 2023-10-15 ENCOUNTER — Encounter: Payer: Self-pay | Admitting: Family Medicine

## 2023-10-15 ENCOUNTER — Emergency Department
Admission: EM | Admit: 2023-10-15 | Discharge: 2023-10-15 | Disposition: A | Discharge status: Home / Self Care | Attending: Emergency Medicine | Admitting: Emergency Medicine

## 2023-10-15 ENCOUNTER — Ambulatory Visit: Admitting: Family Medicine

## 2023-10-15 ENCOUNTER — Other Ambulatory Visit: Payer: Self-pay

## 2023-10-15 ENCOUNTER — Emergency Department

## 2023-10-15 VITALS — BP 77/54 | HR 104 | Resp 16 | Ht 62.9 in | Wt 128.6 lb

## 2023-10-15 DIAGNOSIS — Z7984 Long term (current) use of oral hypoglycemic drugs: Secondary | ICD-10-CM | POA: Diagnosis not present

## 2023-10-15 DIAGNOSIS — E119 Type 2 diabetes mellitus without complications: Secondary | ICD-10-CM | POA: Diagnosis not present

## 2023-10-15 DIAGNOSIS — D72829 Elevated white blood cell count, unspecified: Secondary | ICD-10-CM | POA: Diagnosis not present

## 2023-10-15 DIAGNOSIS — R1110 Vomiting, unspecified: Secondary | ICD-10-CM | POA: Diagnosis not present

## 2023-10-15 DIAGNOSIS — E1165 Type 2 diabetes mellitus with hyperglycemia: Secondary | ICD-10-CM | POA: Diagnosis not present

## 2023-10-15 DIAGNOSIS — E860 Dehydration: Secondary | ICD-10-CM | POA: Diagnosis not present

## 2023-10-15 DIAGNOSIS — R197 Diarrhea, unspecified: Secondary | ICD-10-CM | POA: Diagnosis not present

## 2023-10-15 DIAGNOSIS — I959 Hypotension, unspecified: Secondary | ICD-10-CM | POA: Diagnosis not present

## 2023-10-15 LAB — URINALYSIS, ROUTINE W REFLEX MICROSCOPIC
Bilirubin Urine: NEGATIVE
Glucose, UA: 50 mg/dL — AB
Hgb urine dipstick: NEGATIVE
Ketones, ur: NEGATIVE mg/dL
Leukocytes,Ua: NEGATIVE
Nitrite: NEGATIVE
Protein, ur: 30 mg/dL — AB
Specific Gravity, Urine: 1.02 (ref 1.005–1.030)
pH: 5 (ref 5.0–8.0)

## 2023-10-15 LAB — COMPREHENSIVE METABOLIC PANEL WITH GFR
ALT: 25 U/L (ref 0–44)
AST: 37 U/L (ref 15–41)
Albumin: 3.8 g/dL (ref 3.5–5.0)
Alkaline Phosphatase: 72 U/L (ref 38–126)
Anion gap: 10 (ref 5–15)
BUN: 8 mg/dL (ref 6–20)
CO2: 22 mmol/L (ref 22–32)
Calcium: 8.9 mg/dL (ref 8.9–10.3)
Chloride: 100 mmol/L (ref 98–111)
Creatinine, Ser: 1.13 mg/dL — ABNORMAL HIGH (ref 0.44–1.00)
GFR, Estimated: 57 mL/min — ABNORMAL LOW (ref >60)
Glucose, Bld: 267 mg/dL — ABNORMAL HIGH (ref 70–99)
Potassium: 3.7 mmol/L (ref 3.5–5.1)
Sodium: 132 mmol/L — ABNORMAL LOW (ref 135–145)
Total Bilirubin: 0.8 mg/dL (ref 0.0–1.2)
Total Protein: 7.3 g/dL (ref 6.5–8.1)

## 2023-10-15 LAB — CBC WITH DIFFERENTIAL/PLATELET
Abs Immature Granulocytes: 0.18 10*3/uL — ABNORMAL HIGH (ref 0.00–0.07)
Basophils Absolute: 0.1 10*3/uL (ref 0.0–0.1)
Basophils Relative: 0 %
Eosinophils Absolute: 0 10*3/uL (ref 0.0–0.5)
Eosinophils Relative: 0 %
HCT: 34 % — ABNORMAL LOW (ref 36.0–46.0)
Hemoglobin: 11.3 g/dL — ABNORMAL LOW (ref 12.0–15.0)
Immature Granulocytes: 1 %
Lymphocytes Relative: 4 %
Lymphs Abs: 0.7 10*3/uL (ref 0.7–4.0)
MCH: 27.8 pg (ref 26.0–34.0)
MCHC: 33.2 g/dL (ref 30.0–36.0)
MCV: 83.5 fL (ref 80.0–100.0)
Monocytes Absolute: 1.5 10*3/uL — ABNORMAL HIGH (ref 0.1–1.0)
Monocytes Relative: 7 %
Neutro Abs: 17.9 10*3/uL — ABNORMAL HIGH (ref 1.7–7.7)
Neutrophils Relative %: 88 %
Platelets: 197 10*3/uL (ref 150–400)
RBC: 4.07 MIL/uL (ref 3.87–5.11)
RDW: 12.3 % (ref 11.5–15.5)
WBC: 20.4 10*3/uL — ABNORMAL HIGH (ref 4.0–10.5)
nRBC: 0 % (ref 0.0–0.2)

## 2023-10-15 LAB — POCT GLUCOSE (2 HR PP): Glucose 2 Hr PP, POC: 206 mg/dL

## 2023-10-15 MED ORDER — ONDANSETRON HCL 4 MG/2ML IJ SOLN
4.0000 mg | Freq: Once | INTRAMUSCULAR | Status: AC
  Administered 2023-10-15: 4 mg via INTRAVENOUS
  Filled 2023-10-15: qty 2

## 2023-10-15 MED ORDER — METOCLOPRAMIDE HCL 5 MG/ML IJ SOLN
10.0000 mg | Freq: Once | INTRAMUSCULAR | Status: AC
  Administered 2023-10-15: 10 mg via INTRAVENOUS
  Filled 2023-10-15: qty 2

## 2023-10-15 MED ORDER — SODIUM CHLORIDE 0.9 % IV BOLUS
1000.0000 mL | Freq: Once | INTRAVENOUS | Status: AC
  Administered 2023-10-15: 1000 mL via INTRAVENOUS

## 2023-10-15 NOTE — ED Triage Notes (Signed)
 Pt to ED from home, POV for dehydration. States saw PCP today and BP was 88/56 and HR was 105 and they told her to come to ED because probably dehydrated. States when she gets from sitting to standing she gets lightheaded. States was sick for 1 whole week vomiting until last Friday. BP in triage is 120/75.  Denies SOB, chest pain. Mild HA. Pt is ambulatory. Skin dry. Pt is diabetic, CBG a little while ago was 205.

## 2023-10-15 NOTE — Progress Notes (Signed)
   Established Patient Office Visit  Subjective  Patient ID: Paula Mcmahon, female    DOB: 19-Oct-1965  Age: 58 y.o. MRN: [id number]  Chief Complaint  Patient presents with   Diabetes    Metformin  upsets stomach. Still has no appetite.    DIABETES: Paula Mcmahon is a pleasant 58 year old female patient who presents for the medical management of diabetes.  Medication compliance: glipizide  5mg  daily, metformin  500mg  BID, & Januvia  25mg  daily  Reports not eating much this morning and did take her glipizide  this morning, along with her other medications.  Currently feeling dizzy and nauseated.  Still lacking an appetite- felt nauseous this morning and took a Zofran .  Reports she is staying hydrated- no vomiting episodes recently.  She reports frequent diarrhea- unable to recall the number of times in a day.      Lab Results  Component Value Date   HGBA1C 10.6 (A) 10/01/2023    No foot exam found Lab Results  Component Value Date   MICROALBUR 3.0 (H) 12/29/2014    Wt Readings from Last 3 Encounters:  10/15/23 128 lb 9.6 oz (58.3 kg)  10/01/23 124 lb 6.4 oz (56.4 kg)  09/30/23 120 lb (54.4 kg)   ROS: see HPI     Objective:      BP (!) 77/54   Pulse (!) 104   Resp 16   Ht 5' 2.9" (1.598 m)   Wt 128 lb 9.6 oz (58.3 kg)   LMP 12/30/2017 Comment: not preg per pt  SpO2 98%   BMI 22.85 kg/m  BP Readings from Last 3 Encounters:  10/15/23 (!) 77/54  10/02/23 (!) 171/97  10/01/23 136/86     Physical Exam Vitals reviewed.  Constitutional:      Appearance: Normal appearance.  Neurological:     Mental Status: She is alert.       Assessment & Plan:   1. Uncontrolled type 2 diabetes mellitus with hyperglycemia (HCC) (Primary) Recent hemoglobin A1c completed 10/01/2023 with results of 10.6%. Patient is currently taking glipizide  5mg  daily, metformin  500mg  BID, & Januvia  25mg  daily.  Patient reports that she is still feeling nauseous at times and is also feeling  lightheaded.  She does report abdominal cramping and diarrhea.  Also reports she has had a decreased appetite and reports she been able to stay hydrated. Denies chest pain, shortness of breath, polyuria, polyphasia, polydipsia, urinary symptoms, abdominal pain, and syncopal episodes. Patient is toxic-appearing with abnormal vital signs-- showing hypotension and tachycardia. Lungs sounds clear to auscultation- no adventitious lungs sounds present. Abdominal exam with hyperactive bowel sounds with no tenderness to palpation. No CVA tenderness or flank pain present. Random blood sugar 206 mg/dL- patient reports she had a couple bites of rice prior to her appointment. Advised patient to stop metformin  to see if diarrhea and dehydration resolve. Discussed concerns with patient regarding most likely dehydration and advised her to go to emergency department, declines EMS. Reports her mother drove her here and will drive her to the ED.   2. Dehydration See #1 With various readings, patient is hypotensive and tachycardic. She is orthostatic with her blood pressure decreasing with standing position. Advised patient to seek emergency care for rehydration with IVFs. Will follow-up with patient in 2 weeks.    Return in about 2 weeks (around 10/29/2023) for ED/UC f/u .    [name], FNP

## 2023-10-15 NOTE — Patient Instructions (Signed)

## 2023-10-15 NOTE — Discharge Instructions (Signed)
 You are seen in the ER for your dehydration in the setting of your recent vomiting and diarrhea.  Continue to use your nausea medication as needed and make sure you are eating and drinking regularly.  Follow with your primary care doctor for further evaluation.  Return to the ER for new or worsening symptoms.

## 2023-10-15 NOTE — ED Provider Notes (Signed)
 [facility] Provider Note    Event Date/Time   First MD Initiated Contact with Patient 10/15/23 2009     (approximate)   History   Dehydration   HPI  Paula Mcmahon is a 58 year old female with history of diabetes presenting to the emergency department for evaluation of vomiting and diarrhea.  2 weeks ago, patient had onset of multiple episodes of nonbloody vomiting and diarrhea.  Her vomiting resolved about a week ago, but has continued to have frequent episodes of diarrhea.  Reports mild abdominal cramping.  Does think this is starting to improve.  She saw her primary care doctor today where she was noted to have low blood pressure and elevated heart rate with concerns for dehydration for which she was directed to the emergency department.  Reports mild cough, no shortness of breath or chest pain.  Denies dysuria, urinary frequency.    Physical Exam   Triage Vital Signs: ED Triage Vitals  Encounter Vitals Group     BP 10/15/23 1526 120/75     Systolic BP Percentile --      Diastolic BP Percentile --      Pulse Rate 10/15/23 1526 (!) 106     Resp 10/15/23 1526 20     Temp 10/15/23 1526 98.3 F (36.8 C)     Temp Source 10/15/23 1526 Oral     SpO2 10/15/23 1526 100 %     Weight 10/15/23 1527 128 lb 9.5 oz (58.3 kg)     Height 10/15/23 1527 5\' 3"  (1.6 m)     Head Circumference --      Peak Flow --      Pain Score 10/15/23 1523 6     Pain Loc --      Pain Education --      Exclude from Growth Chart --     Most recent vital signs: Vitals:   10/15/23 1526  BP: 120/75  Pulse: (!) 106  Resp: 20  Temp: 98.3 F (36.8 C)  SpO2: 100%     General: Awake, interactive  CV:  Mild tachycardia, good peripheral perfusion Resp:  Unlabored respirations, lungs good auscultation Abd:  Nondistended, soft, nontender Neuro:  Symmetric facial movement, fluid speech   ED Results / Procedures / Treatments   Labs (all labs ordered are listed, but only  abnormal results are displayed) Labs Reviewed  CBC WITH DIFFERENTIAL/PLATELET - Abnormal; Notable for the following components:      Result Value   WBC 20.4 (*)    Hemoglobin 11.3 (*)    HCT 34.0 (*)    Neutro Abs 17.9 (*)    Monocytes Absolute 1.5 (*)    Abs Immature Granulocytes 0.18 (*)    All other components within normal limits  COMPREHENSIVE METABOLIC PANEL WITH GFR - Abnormal; Notable for the following components:   Sodium 132 (*)    Glucose, Bld 267 (*)    Creatinine, Ser 1.13 (*)    GFR, Estimated 57 (*)    All other components within normal limits  URINALYSIS, ROUTINE W REFLEX MICROSCOPIC - Abnormal; Notable for the following components:   Color, Urine YELLOW (*)    APPearance CLOUDY (*)    Glucose, UA 50 (*)    Protein, ur 30 (*)    Bacteria, UA RARE (*)    All other components within normal limits     EKG EKG independently reviewed interpreted by myself (ER attending) demonstrates:    RADIOLOGY Imaging independently reviewed and  interpreted by myself demonstrates:  CXR without focal consolidation  Formal Radiology Read:  DG Chest 2 View Result Date: 10/15/2023 CLINICAL DATA:  Low blood pressure and elevated heart rate. EXAM: CHEST - 2 VIEW COMPARISON:  None Available. FINDINGS: The heart size and mediastinal contours are within normal limits. There is no evidence of acute infiltrate, pleural effusion or pneumothorax. Radiopaque surgical clips are seen within the right upper quadrant. The visualized skeletal structures are unremarkable. IMPRESSION: No active cardiopulmonary disease. Electronically Signed   By: Virgle Grime M.D.   On: 10/15/2023 21:22    PROCEDURES:  Critical Care performed: No  Procedures   MEDICATIONS ORDERED IN ED: Medications  ondansetron  (ZOFRAN ) injection 4 mg (4 mg Intravenous Given 10/15/23 2140)  sodium chloride  0.9 % bolus 1,000 mL (1,000 mLs Intravenous New Bag/Given 10/15/23 2140)     IMPRESSION / MDM / ASSESSMENT AND  PLAN / ED COURSE  I reviewed the triage vital signs and the nursing notes.  Differential diagnosis includes, but is not limited to, dehydration, electrolyte abnormality, viral GI illness, lower suspicion acute intra-abdominal process particularly with negative CT scan after onset of symptoms  Patient's presentation is most consistent with acute presentation with potential threat to life or bodily function.  58 year old female presenting to the emergency department for evaluation of vomiting and diarrhea.  Blood pressure improved on presentation here.  Ordered for IV fluids and Zofran .  Does feel improved on reevaluation.  Labs with leukocytosis with WBC of 20, mild anemia, CMP with mild hyperglycemia without evidence of DKA.  Urinalysis overall not suggestive of infection.  Leukocytosis potentially reactive given her ongoing GI symptoms.  She had a CT scan 2 weeks ago and has a benign abdomen, do not think there is an indication for repeat imaging.  X-Candy Leverett ordered without evidence of pneumonia.  Patient is tolerating p.o. and feels improved after fluids.  She is comfortable with discharge home.  Do think this is reasonable.  Strict return precautions provided.  Patient discharged in stable condition.      FINAL CLINICAL IMPRESSION(S) / ED DIAGNOSES   Final diagnoses:  Vomiting and diarrhea  Dehydration     Rx / DC Orders   ED Discharge Orders     None        Note:  This document was prepared using Dragon voice recognition software and may include unintentional dictation errors.   Claria Crofts, MD 10/15/23 2255

## 2023-10-16 ENCOUNTER — Ambulatory Visit: Payer: Self-pay

## 2023-10-16 DIAGNOSIS — R197 Diarrhea, unspecified: Secondary | ICD-10-CM

## 2023-10-16 DIAGNOSIS — R110 Nausea: Secondary | ICD-10-CM

## 2023-10-16 NOTE — Addendum Note (Signed)
 Addended by: Evans Him on: 10/16/2023 01:08 PM   Modules accepted: Orders

## 2023-10-16 NOTE — Telephone Encounter (Signed)
  Chief Complaint: GI referral request Symptoms: vomiting, diarrhea Frequency: ongoing Pertinent Negatives: Patient mother denies new symptoms Disposition: [] ED /[] Urgent Care (no appt availability in office) / [] Appointment(In office/virtual)/ []  [facility]/ [] Home Care/ [] Refused Recommended Disposition /[] [facility]/ [x]  Follow-up with PCP Additional Notes:  Mother (DPR) Paula Mcmahon calling, she is not with patient, they have been texting back and forth. Yesterday sent by PCP to hospital for low blood pressure and high heart rate, she received fluids and nausea medication. Chest xray ruled out pneumonia. She had a CT of abdomen a few weeks ago for ongoing nausea and diarrhea. Paula Mcmahon states "She really needs a colonoscopy". Vomited X 1 this morning. Requesting referral for GI. Already has follow up scheduled on 10/29/23. Paula Mcmahon will have Paula Mcmahon call for triage if symptoms progress today. Please advise on GI referral.   Copied from CRM [id number]. Topic: Clinical - Red Word Triage >> Oct 16, 2023 11:49 AM [name] wrote: Red Word that prompted transfer to Nurse Triage: The patient's Mother states that the patient was sent to the hospital during her appointment with FNP [name] yesterday due to low blood pressure and a high heart rate. She states the patient's symptoms are worsening and the hospital advised them that the patient's labs are normal but they do not feel that they are. The patient has been experiencing weakness in her legs and she threw up last night and this morning. Reason for Disposition  [1] MILD vomiting with diarrhea AND [2] present > 5 days  Protocols used: Vomiting-A-AH

## 2023-10-16 NOTE — Telephone Encounter (Signed)
 GI referral placed

## 2023-10-17 ENCOUNTER — Telehealth: Payer: Self-pay | Admitting: Family Medicine

## 2023-10-17 ENCOUNTER — Telehealth: Payer: Self-pay

## 2023-10-17 DIAGNOSIS — R197 Diarrhea, unspecified: Secondary | ICD-10-CM

## 2023-10-17 DIAGNOSIS — R82998 Other abnormal findings in urine: Secondary | ICD-10-CM

## 2023-10-17 DIAGNOSIS — D72829 Elevated white blood cell count, unspecified: Secondary | ICD-10-CM

## 2023-10-17 NOTE — Telephone Encounter (Signed)
 Copied from CRM [id number]. Topic: General - Other >> Oct 17, 2023  2:40 PM Felizardo Hotter wrote: Reason for CRM: Pt's mother Edris Gowers ph: [phone] is calling on behalf of pt. Debria Fang would like to go over lab results from 10/15/2023 with someone in office. Please call pt at  [phone]

## 2023-10-17 NOTE — Telephone Encounter (Signed)
 Called patient to discuss recent labs completed in ED-- labs with leukocytosis with WBC of 20, mild anemia, CMP with hyperglycemia without evidence of DKA. Discussed with patient that leukocytosis may be due to ongoing GI symptoms. Due to ongoing diarrhea and recent antibiotic use, will order C. diff stool studies. Will repeat CBC on Monday 10/21/2023. Discussed that urinalysis is overall not suggestive of infection- most likely due to contamination. Advised patient to ensure adequate fluid intake, monitor heart rate and blood pressure, and take Imodium PRN. Reasonable to obtain repeat labs on Monday since patient is not febrile or showing other systemic symptoms at this time. Patient verbalized understanding and agrees to plan of care. Orders placed for CBC, UA and C. diff stool samples.   Paula Hanson, FNP-C [facility]  [address], [place], [place] [address] [phone]

## 2023-10-17 NOTE — Telephone Encounter (Signed)
 Patient wants to know if she has an infection that needs to be treated due to lab results from recent ED visit on 10/15/23

## 2023-10-21 ENCOUNTER — Ambulatory Visit

## 2023-10-25 ENCOUNTER — Ambulatory Visit

## 2023-10-25 ENCOUNTER — Other Ambulatory Visit: Payer: Self-pay | Admitting: *Deleted

## 2023-10-25 DIAGNOSIS — R197 Diarrhea, unspecified: Secondary | ICD-10-CM

## 2023-10-25 DIAGNOSIS — R82998 Other abnormal findings in urine: Secondary | ICD-10-CM

## 2023-10-25 DIAGNOSIS — D72829 Elevated white blood cell count, unspecified: Secondary | ICD-10-CM | POA: Diagnosis not present

## 2023-10-26 LAB — CBC WITH DIFFERENTIAL/PLATELET
Basophils Absolute: 0 10*3/uL (ref 0.0–0.2)
Basos: 1 %
EOS (ABSOLUTE): 0.1 10*3/uL (ref 0.0–0.4)
Eos: 1 %
Hematocrit: 32.4 % — ABNORMAL LOW (ref 34.0–46.6)
Hemoglobin: 10.7 g/dL — ABNORMAL LOW (ref 11.1–15.9)
Immature Grans (Abs): 0 10*3/uL (ref 0.0–0.1)
Immature Granulocytes: 0 %
Lymphocytes Absolute: 2.5 10*3/uL (ref 0.7–3.1)
Lymphs: 32 %
MCH: 27.7 pg (ref 26.6–33.0)
MCHC: 33 g/dL (ref 31.5–35.7)
MCV: 84 fL (ref 79–97)
Monocytes Absolute: 0.4 10*3/uL (ref 0.1–0.9)
Monocytes: 5 %
Neutrophils Absolute: 4.6 10*3/uL (ref 1.4–7.0)
Neutrophils: 61 %
Platelets: 302 10*3/uL (ref 150–450)
RBC: 3.86 x10E6/uL (ref 3.77–5.28)
RDW: 12.9 % (ref 11.7–15.4)
WBC: 7.6 10*3/uL (ref 3.4–10.8)

## 2023-10-26 LAB — URINALYSIS, MICROSCOPIC ONLY
Bacteria, UA: NONE SEEN
Casts: NONE SEEN /LPF
RBC, Urine: NONE SEEN /HPF (ref 0–2)

## 2023-10-29 ENCOUNTER — Encounter: Payer: Self-pay | Admitting: Family Medicine

## 2023-10-29 ENCOUNTER — Ambulatory Visit: Admitting: Family Medicine

## 2023-10-29 VITALS — BP 138/87 | HR 86 | Temp 98.2°F | Resp 18 | Ht 63.0 in | Wt 123.0 lb

## 2023-10-29 DIAGNOSIS — E1165 Type 2 diabetes mellitus with hyperglycemia: Secondary | ICD-10-CM | POA: Diagnosis not present

## 2023-10-29 DIAGNOSIS — R110 Nausea: Secondary | ICD-10-CM | POA: Diagnosis not present

## 2023-10-29 DIAGNOSIS — R197 Diarrhea, unspecified: Secondary | ICD-10-CM | POA: Diagnosis not present

## 2023-10-29 NOTE — Progress Notes (Signed)
 Established Patient Office Visit  Subjective  Patient ID: Paula Mcmahon, female    DOB: [birth year]  Age: 58 y.o. MRN: [id number]  Chief Complaint  Patient presents with   Follow-up   Meghanne Mcmahon is a pleasant 58 year old female patient who presents today for follow-up after last office visit she was hypotensive and tachycardic. She had been experiencing ongoing diarrhea and vomiting x2 weeks at the time. Patient was sent to [facility] ED and was ultimately discharged after given IVFs. Denies needing to take nausea medication recently. She does report her appetite is decreased. Reports she is still having loose stool- gave stool sample to lab today.   Lab work was repeated after being discharged. See results:  Recent WBCs went from 20.4 to 7.6 Hgb11.3 to 10.7 Hct 34.0 to 32.4 UA- no signs of infection   Denies chest pain, shortness of breath, vision changes, polydipsia, polyphagia, polyuria, open wounds/ulcers on feet.  Pertinent lab work: A1C: 10.6% on 10/01/2023 Continue current medication regimen: glipizide  5mg  daily & Januvia  25mg  daily   Blood sugar readings: "most days it is doing okay." Highest 170 fasting.   Lab Results  Component Value Date   HGBA1C 10.6 (A) 10/01/2023    No foot exam found Lab Results  Component Value Date   MICROALBUR 3.0 (H) 12/29/2014    Wt Readings from Last 3 Encounters:  10/29/23 123 lb (55.8 kg)  10/15/23 128 lb 9.5 oz (58.3 kg)  10/15/23 128 lb 9.6 oz (58.3 kg)   ROS: see HPI    Objective:      BP 138/87 (BP Location: Left Arm, Patient Position: Sitting, Cuff Size: Normal)   Pulse 86   Temp 98.2 F (36.8 C) (Oral)   Resp 18   Ht 5\' 3"  (1.6 m)   Wt 123 lb (55.8 kg)   LMP 12/30/2017 Comment: not preg per pt  SpO2 99%   BMI 21.79 kg/m  BP Readings from Last 3 Encounters:  10/29/23 138/87  10/15/23 124/76  10/15/23 (!) 77/54     Physical Exam Vitals reviewed.  Constitutional:      Appearance: Normal appearance.  Cardiovascular:      Rate and Rhythm: Normal rate and regular rhythm.     Pulses: Normal pulses.     Heart sounds: Normal heart sounds.  Pulmonary:     Effort: Pulmonary effort is normal.     Breath sounds: Normal breath sounds.  Neurological:     Mental Status: She is alert.  Psychiatric:        Mood and Affect: Mood normal.        Behavior: Behavior normal.       Assessment & Plan:   1. Nausea (Primary) Patient is a pleasant 58 year old female patient who presents today for follow-up. Last office visit, 5/13, patient was sent to the ED for persistent N/V/D resulting in dehydration. Vital signs today stable. Patient reports she is feeling a lot better and is having less episodes of vomiting and diarrhea. She reports that she is still experiencing occasional nausea. Discussed symptoms of hypoglycemia. Advised patient to continue to monitor blood sugar readings. Return in about two months to repeat hemoglobin A1c.   2. Uncontrolled type 2 diabetes mellitus with hyperglycemia (HCC) A1c last checked on 10/01/2023 with results of 10.6%. Stopped metformin  due to persistent GI symptoms. Continue to take glipizide  5mg  daily & Januvia  25mg  daily. Counseled patient to continue to check fasting blood sugar in the morning or whenever she is symptomatic. Discussed  nonpharmaological interventions such as focusing on eating a low carb diet, high in vegetables and fruits and daily physical exercise. Discussed signs and symptoms of hypoglycemia and need to present to the ED. Patient verbalizes understanding regarding plan of care and all questions answered. Will obtain urine microalbumin/creatinine today.  - Microalbumin / creatinine urine ratio      Return in about 9 weeks (around 12/31/2023) for Physical with fasting labs.    Paula Hanson, FNP

## 2023-10-29 NOTE — Patient Instructions (Signed)

## 2023-10-30 LAB — MICROALBUMIN / CREATININE URINE RATIO
Creatinine, Urine: 303.3 mg/dL
Microalb/Creat Ratio: 71 mg/g{creat} — ABNORMAL HIGH (ref 0–29)
Microalbumin, Urine: 214.2 ug/mL

## 2023-10-30 LAB — CLOSTRIDIUM DIFFICILE BY PCR: Toxigenic C. Difficile by PCR: POSITIVE — AB

## 2023-10-31 ENCOUNTER — Telehealth: Payer: Self-pay

## 2023-10-31 NOTE — Telephone Encounter (Signed)
 Copied from CRM [id number]. Topic: Clinical - Lab/Test Results >> Oct 31, 2023  3:10 PM Ary Bitter R wrote: Reason for CRM: Pt received her results from her urine and stool labs via mychart. She says they came back abnormal and wants to speak with someone about it.

## 2023-11-01 ENCOUNTER — Other Ambulatory Visit: Payer: Self-pay | Admitting: Family Medicine

## 2023-11-01 DIAGNOSIS — A0472 Enterocolitis due to Clostridium difficile, not specified as recurrent: Secondary | ICD-10-CM

## 2023-11-01 MED ORDER — VANCOMYCIN HCL 125 MG PO CAPS: 125.0000 mg | ORAL_CAPSULE | Freq: Four times a day (QID) | ORAL | 0 refills | Status: AC

## 2023-11-01 NOTE — Progress Notes (Signed)
 Called patient and verified using two identifications. Discussed lab results of stool test, dietary recommendations, antibiotic regimen, and importance of hand washing with soap and water. Vancomycin sent to pharmacy on file. Patient verbalized an understanding and all questions were answered. Plan to repeat urine microalbumin in 6-8 weeks after completion of antibiotic regimen.

## 2023-12-24 ENCOUNTER — Ambulatory Visit: Payer: Self-pay

## 2023-12-31 ENCOUNTER — Encounter: Admitting: Family Medicine

## 2024-01-09 ENCOUNTER — Encounter: Admitting: Family Medicine

## 2024-01-20 ENCOUNTER — Ambulatory Visit (INDEPENDENT_AMBULATORY_CARE_PROVIDER_SITE_OTHER): Admitting: Family Medicine

## 2024-01-20 ENCOUNTER — Other Ambulatory Visit: Payer: Self-pay

## 2024-01-20 ENCOUNTER — Other Ambulatory Visit (HOSPITAL_COMMUNITY)
Admission: RE | Admit: 2024-01-20 | Discharge: 2024-01-20 | Disposition: A | Discharge status: Home / Self Care | Source: Ambulatory Visit | Attending: Family Medicine | Admitting: Family Medicine

## 2024-01-20 VITALS — BP 148/97 | HR 89 | Resp 16 | Ht 63.0 in | Wt 124.3 lb

## 2024-01-20 DIAGNOSIS — E1169 Type 2 diabetes mellitus with other specified complication: Secondary | ICD-10-CM

## 2024-01-20 DIAGNOSIS — Z1212 Encounter for screening for malignant neoplasm of rectum: Secondary | ICD-10-CM

## 2024-01-20 DIAGNOSIS — Z23 Encounter for immunization: Secondary | ICD-10-CM

## 2024-01-20 DIAGNOSIS — Z1211 Encounter for screening for malignant neoplasm of colon: Secondary | ICD-10-CM | POA: Diagnosis not present

## 2024-01-20 DIAGNOSIS — Z7984 Long term (current) use of oral hypoglycemic drugs: Secondary | ICD-10-CM

## 2024-01-20 DIAGNOSIS — E1165 Type 2 diabetes mellitus with hyperglycemia: Secondary | ICD-10-CM

## 2024-01-20 DIAGNOSIS — Z0000 Encounter for general adult medical examination without abnormal findings: Secondary | ICD-10-CM | POA: Diagnosis not present

## 2024-01-20 DIAGNOSIS — Z124 Encounter for screening for malignant neoplasm of cervix: Secondary | ICD-10-CM | POA: Diagnosis not present

## 2024-01-20 DIAGNOSIS — Z1231 Encounter for screening mammogram for malignant neoplasm of breast: Secondary | ICD-10-CM | POA: Diagnosis not present

## 2024-01-20 DIAGNOSIS — E785 Hyperlipidemia, unspecified: Secondary | ICD-10-CM | POA: Diagnosis not present

## 2024-01-20 DIAGNOSIS — E559 Vitamin D deficiency, unspecified: Secondary | ICD-10-CM | POA: Diagnosis not present

## 2024-01-20 DIAGNOSIS — D72829 Elevated white blood cell count, unspecified: Secondary | ICD-10-CM

## 2024-01-20 DIAGNOSIS — Z1329 Encounter for screening for other suspected endocrine disorder: Secondary | ICD-10-CM

## 2024-01-20 MED ORDER — DEXCOM G6 SENSOR MISC
3 refills | Status: AC
  Filled 2024-01-20 – 2024-01-22 (×2): qty 3, 30d supply, fill #0

## 2024-01-20 MED ORDER — VITAMIN D 50 MCG (2000 UT) PO TABS
2000.0000 [IU] | ORAL_TABLET | Freq: Every day | ORAL | 2 refills | Status: AC
  Filled 2024-01-20: qty 30, 30d supply, fill #0
  Filled 2024-06-24: qty 30, 30d supply, fill #1

## 2024-01-20 MED ORDER — GLIPIZIDE 5 MG PO TABS
5.0000 mg | ORAL_TABLET | Freq: Every day | ORAL | 2 refills | Status: AC
  Filled 2024-01-20: qty 30, 30d supply, fill #0
  Filled 2024-06-24: qty 30, 30d supply, fill #1

## 2024-01-20 MED ORDER — SITAGLIPTIN PHOSPHATE 25 MG PO TABS
25.0000 mg | ORAL_TABLET | Freq: Every day | ORAL | 2 refills | Status: AC
  Filled 2024-01-20: qty 30, 30d supply, fill #0
  Filled 2024-06-24: qty 30, 30d supply, fill #1

## 2024-01-20 NOTE — Patient Instructions (Signed)
 Health Maintenance Recommendations Screening Testing Mammogram Every 1 -2 years based on history and risk factors Starting at age 56 Pap Smear Ages 21-39 every 3 years Ages 23-65 every 5 years with HPV testing More frequent testing may be required based on results and history Colon Cancer Screening Every 1-10 years based on test performed, risk factors, and history Starting at age 102 Bone Density Screening Every 2-10 years based on history Starting at age 69 for women Recommendations for men differ based on medication usage, history, and risk factors AAA Screening One time ultrasound Men 30-10 years old who have every smoked Lung Cancer Screening Low Dose Lung CT every 12 months Age 20-80 years with a 30 pack-year smoking history who still smoke or who have quit within the last 15 years   Screening Labs Routine  Labs: Complete Blood Count (CBC), Complete Metabolic Panel (CMP), Cholesterol (Lipid Panel) Every 6-12 months based on history and medications May be recommended more frequently based on current conditions or previous results Hemoglobin A1c Lab Every 3-12 months based on history and previous results Starting at age 24 or earlier with diagnosis of diabetes, high cholesterol, BMI >26, and/or risk factors Frequent monitoring for patients with diabetes to ensure blood sugar control Thyroid Panel (TSH w/ T3 & T4) Every 6 months based on history, symptoms, and risk factors May be repeated more often if on medication HIV One time testing for all patients 23 and older May be repeated more frequently for patients with increased risk factors or exposure Hepatitis C One time testing for all patients 47 and older May be repeated more frequently for patients with increased risk factors or exposure Gonorrhea, Chlamydia Every 12 months for all sexually active persons 13-24 years Additional monitoring may be recommended for those who are considered high risk or who have  symptoms PSA Men 72-66 years old with risk factors Additional screening may be recommended from age 2-69 based on risk factors, symptoms, and history   Vaccine Recommendations Tetanus Booster All adults every 10 years Flu Vaccine All patients 6 months and older every year COVID Vaccine All patients 12 years and older Initial dosing with booster May recommend additional booster based on age and health history HPV Vaccine 2 doses all patients age 56-26 Dosing may be considered for patients over 26 Shingles Vaccine (Shingrix) 2 doses all adults 55 years and older Pneumonia (Pneumovax 23) All adults 65 years and older May recommend earlier dosing based on health history Pneumonia (Prevnar 16) All adults 65 years and older Dosed 1 year after Pneumovax 23   Additional Screening, Testing, and Vaccinations may be recommended on an individualized basis based on family history, health history, risk factors, and/or exposure.  __________________________________________________________   Diet Recommendations for All Patients   I recommend that all patients maintain a diet low in saturated fats, carbohydrates, and cholesterol. While this can be challenging at first, it is not impossible and small changes can make big differences.  Things to try: Decreasing the amount of soda, sweet tea, and/or juice to one or less per day and replace with water While water is always the first choice, if you do not like water you may consider adding a water additive without sugar to improve the taste other sugar free drinks Replace potatoes with a brightly colored vegetable at dinner Use healthy oils, such as canola oil or olive oil, instead of butter or hard margarine Limit your bread intake to two pieces or less a day Replace regular pasta with  low carb pasta options Bake, broil, or grill foods instead of frying Monitor portion sizes  Eat smaller, more frequent meals throughout the day instead of large  meals   An important thing to remember is, if you love foods that are not great for your health, you don't have to give them up completely. Instead, allow these foods to be a reward when you have done well. Allowing yourself to still have special treats every once in a while is a nice way to tell yourself thank you for working hard to keep yourself healthy.    Also remember that every day is a new day. If you have a bad day and "fall off the wagon", you can still climb right back up and keep moving along on your journey!   We have resources available to help you!  Some websites that may be helpful include: www.http://www.wall-moore.info/        Www.VeryWellFit.com _____________________________________________________________   Activity Recommendations for All Patients   I recommend that all adults get at least 20 minutes of moderate physical activity that elevates your heart rate at least 5 days out of the week.  Some examples include: Walking or jogging at a pace that allows you to carry on a conversation Cycling (stationary bike or outdoors) Water aerobics Yoga Weight lifting Dancing If physical limitations prevent you from putting stress on your joints, exercise in a pool or seated in a chair are excellent options.   Do determine your MAXIMUM heart rate for activity: YOUR AGE - 220 = MAX HeartRate    Remember! Do not push yourself too hard.  Start slowly and build up your pace, speed, weight, time in exercise, etc.  Allow your body to rest between exercise and get good sleep. You will need more water than normal when you are exerting yourself. Do not wait until you are thirsty to drink. Drink with a purpose of getting in at least 8, 8 ounce glasses of water a day plus more depending on how much you exercise and sweat.      If you begin to develop dizziness, chest pain, abdominal pain, jaw pain, shortness of breath, headache, vision changes, lightheadedness, or other concerning symptoms, stop the  activity and allow your body to rest. If your symptoms are severe, seek emergency evaluation immediately. If your symptoms are concerning, but not severe, please let us know so that we can recommend further evaluation.

## 2024-01-20 NOTE — Progress Notes (Signed)
 Subjective:   Paula Mcmahon 1966-03-04  01/20/2024   CC: Chief Complaint  Patient presents with   Annual Exam    HPI: Paula Mcmahon is a 58 y.o. female who presents for a routine health maintenance exam.  Labs collected at time of visit- last time she ate was this morning.   HEALTH SCREENINGS: - Vision Screening: plans to schedule  - Dental Visits: plans to schedule - Pap smear: updated today - Breast Exam: done today - STD Screening: Declined - Mammogram (40+): Ordered today  - Colonoscopy (45+): Ordered today  - Bone Density (65+ or under 65 with predisposing conditions): Not applicable  - Lung CA screening with low-dose CT:  Not applicable Adults age 54-80 who are current cigarette smokers or quit within the last 15 years. Must have 20 pack year history.   Depression and Anxiety Screen done today and results listed below:     01/20/2024    3:21 PM 10/01/2023    1:18 PM  Depression screen PHQ 2/9  Decreased Interest 0 0  Down, Depressed, Hopeless 0 0  PHQ - 2 Score 0 0  Altered sleeping 0 0  Tired, decreased energy 0 1  Change in appetite 0 0  Feeling bad or failure about yourself  0 1  Trouble concentrating 0 0  Moving slowly or fidgety/restless 0 0  Suicidal thoughts 0 0  PHQ-9 Score 0 2  Difficult doing work/chores Not difficult at all       01/20/2024    3:21 PM 10/01/2023    1:19 PM  GAD 7 : Generalized Anxiety Score  Nervous, Anxious, on Edge 0 0  Control/stop worrying 0 0  Worry too much - different things 0 0  Trouble relaxing 0 0  Restless 0 0  Easily annoyed or irritable 0 0  Afraid - awful might happen 0 0  Total GAD 7 Score 0 0  Anxiety Difficulty Not difficult at all Not difficult at all    IMMUNIZATIONS: - Tdap: Tetanus vaccination status reviewed: last tetanus booster within 10 years. - HPV: Not applicable - Influenza: N/A - Pneumovax: Up to date - Prevnar 20: Administered today - Zostavax (50+): Refused  Past medical history,  surgical history, medications, allergies, family history and social history reviewed with patient today and changes made to appropriate areas of the chart.   Past Medical History:  Diagnosis Date   Anemia    H/O PREGNANCY   Diabetes mellitus without complication (HCC)    type 2   Elbow fracture 07/23/2014   Family history of BRCA2 gene positive    Family history of breast cancer    Family history of colon cancer    Family history of lung cancer    Fracture of arm, left, multiple, closed    Frozen shoulder 2016   LEFT   Genetic testing 07/17/2019   Negative genetic testing. No pathogenic variants identified. 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 The report date is 07/16/2019.      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    Pain provoked by trauma 02/18/2015   Shoulder disorder    frozen left shoulder   Vitamin D  deficiency  Past Surgical History:  Procedure Laterality Date   BREAST BIOPSY Left 01/28/2015   negative, Dr Guinevere office   BREAST BIOPSY Left 03/10/2015   Procedure: BREAST BIOPSY WITH NEEDLE LOCALIZATION;  Surgeon: Louanne KANDICE Muse, MD;  Location: [facility] ORS;  Service: General;  Laterality: Left;   BREAST BIOPSY Right 01/30/2016   benign, Dr Dessa Lona   BREAST EXCISIONAL BIOPSY Left 03/10/2015   CHOLECYSTECTOMY  1998   EXTERNAL FIXATION ARM  07/2014   left arm    FRACTURE SURGERY  07/2014   IRRIGATION AND DEBRIDEMENT SEBACEOUS CYST  1988   Right Shoulder    No current outpatient medications on file prior to visit.   No current facility-administered medications on file prior to visit.    Allergies  Allergen Reactions    Metformin  And Related Diarrhea     Social History   Socioeconomic History   Marital status: Divorced    Spouse name: Not on file   Number of children: Not on file   Years of education: Not on file   Highest education level: Associate degree: occupational, Scientist, product/process development, or vocational program  Occupational History   Not on file  Tobacco Use   Smoking status: Never    Passive exposure: Never   Smokeless tobacco: Never  Substance and Sexual Activity   Alcohol use: No   Drug use: No   Sexual activity: Not Currently    Birth control/protection: Abstinence  Other Topics Concern   Not on file  Social History Narrative   Lives in [place] with husband and daughters.   Dog and cat in home.      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, PT and OT      Diet - regular   Social Drivers of Corporate investment banker Strain: Low Risk  (01/20/2024)   Overall Financial Resource Strain (CARDIA)    Difficulty of Paying Living Expenses: Not very hard  Food Insecurity: No Food Insecurity (01/20/2024)   Hunger Vital Sign    Worried About Running Out of Food in the Last Year: Never true    Ran Out of Food in the Last Year: Never true  Transportation Needs: No Transportation Needs (01/20/2024)   PRAPARE - Administrator, Civil Service (Medical): No    Lack of Transportation (Non-Medical): No  Physical Activity: Insufficiently Active (01/20/2024)   Exercise Vital Sign    Days of Exercise per Week: 3 days    Minutes of Exercise per Session: 20 min  Stress: No Stress Concern Present (01/20/2024)   [employer] of Occupational Health - Occupational Stress Questionnaire    Feeling of Stress: Not at all  Social Connections: Moderately Integrated (01/20/2024)   Social Connection and Isolation Panel    Frequency of Communication with Friends and Family: More than three times a week    Frequency of Social Gatherings with Friends and Family: More  than three times a week    Attends Religious Services: More than 4 times per year    Active Member of [employer] or Organizations: Yes    Attends Engineer, structural: More than 4 times per year    Marital Status: Divorced  Catering manager Violence: Not on file   Social History   Tobacco Use  Smoking Status Never   Passive exposure: Never  Smokeless Tobacco Never   Social History   Substance and Sexual Activity  Alcohol Use No  Family History  Problem Relation Age of Onset   Cancer Maternal Grandmother    Diabetes Maternal Grandmother    Breast cancer Maternal Grandmother        dx 56s   Hyperlipidemia Father    Diabetes Father    Diabetes Sister    Diabetes Brother        Juvenile diabetes   Colon cancer Paternal Aunt        dx 21s, d. 35s   Cancer Paternal Aunt    Lung cancer Maternal Grandfather    Cancer Paternal Grandmother        d. 48s, unk type   Cancer Paternal Grandfather        unk type   Diabetes Brother      ROS: Denies fever, fatigue, unexplained weight loss/gain, chest pain, SHOB, and palpitations. Denies neurological deficits, gastrointestinal or genitourinary complaints, and skin changes.   Objective:   Today's Vitals   01/20/24 1508 01/20/24 1509  BP: (!) 148/97   Pulse: 89   Resp: 16   Weight: 124 lb 4.8 oz (56.4 kg)   Height: 5' 3 (1.6 m)   PainSc: 0-No pain 0-No pain    GENERAL APPEARANCE: Well-appearing, in NAD. Well nourished.  SKIN: Pink, warm and dry. Turgor normal. No rash, lesion, ulceration, or ecchymoses. Hair evenly distributed.  HEENT: HEAD: Normocephalic.  EYES: PERRLA. EOMI. Lids intact w/o defect. Sclera white, Conjunctiva pink w/o exudate.  EARS: External ear w/o redness, swelling, masses or lesions. EAC clear. TM's intact, translucent w/o bulging, appropriate landmarks visualized. Appropriate acuity to conversational tones.  NOSE: Septum midline w/o deformity. Nares patent, mucosa pink and non-inflamed w/o  drainage. No sinus tenderness.  THROAT: Uvula midline. Oropharynx clear. Tonsils non-inflamed w/o exudate. Oral mucosa pink and moist.  NECK: Supple, Trachea midline. Full ROM w/o pain or tenderness. No lymphadenopathy. Thyroid  non-tender w/o enlargement or palpable masses.  BREASTS: Breasts pendulous, symmetrical, and w/o palpable masses. Nipples everted and w/o discharge. No rash or skin retraction. No axillary or supraclavicular lymphadenopathy.  RESPIRATORY: Chest wall symmetrical w/o masses. Respirations even and non-labored. Breath sounds clear to auscultation bilaterally. No wheezes, rales, rhonchi, or crackles. CARDIAC: S1, S2 present, regular rate and rhythm. No gallops, murmurs, rubs, or clicks. PMI w/o lifts, heaves, or thrills. No carotid bruits. Capillary refill <2 seconds. Peripheral pulses 2+ bilaterally. GI: Abdomen soft w/o distention. Normoactive bowel sounds. No palpable masses or tenderness. No guarding or rebound tenderness. Liver and spleen w/o tenderness or enlargement. No CVA tenderness.  GU: External genitalia without erythema, lesions, or masses. No lymphadenopathy. Vaginal mucosa pink and moist without exudate, lesions, or ulcerations. Cervix pink without discharge. Cervical os closed. Uterus and adnexae palpable, not enlarged, and w/o tenderness. No palpable masses.  MSK: Muscle tone and strength appropriate for age, w/o atrophy or abnormal movement.  EXTREMITIES: Active ROM intact, w/o tenderness, crepitus, or contracture. No obvious joint deformities or effusions. No clubbing, edema, or cyanosis.  NEUROLOGIC: CN's II-XII intact. Motor strength symmetrical with no obvious weakness. No sensory deficits. DTR's 2+ symmetric bilaterally. Steady, even gait.  PSYCH/MENTAL STATUS: Alert, oriented x 3. Cooperative, appropriate mood and affect.   Chaperoned by Warren Chiles, CMA   Results for orders placed or performed in visit on 10/29/23  Microalbumin / creatinine urine ratio    Collection Time: 10/29/23  3:17 PM  Result Value Ref Range   Creatinine, Urine 303.3 Not Estab. mg/dL   Microalbumin, Urine 785.7 Not Estab. ug/mL   Microalb/Creat Ratio 71 (  H) 0 - 29 mg/g creat    Assessment & Plan:    1. Wellness examination (Primary) - Encouraged a healthy well-balanced diet. Patient may adjust caloric intake to maintain or achieve ideal body weight. May reduce intake of dietary saturated fat and total fat and have adequate dietary potassium and calcium  preferably from fresh fruits, vegetables, and low-fat dairy products.   - Advised to avoid cigarette smoking. - Discussed with the patient that most people either abstain from alcohol or drink within safe limits (<=14/week and <=4 drinks/occasion for males, <=7/weeks and <= 3 drinks/occasion for females) and that the risk for alcohol disorders and other health effects rises proportionally with the number of drinks per week and how often a drinker exceeds daily limits. - Discussed cessation/primary prevention of drug use and availability of treatment for abuse.  - Discussed sexually transmitted diseases, avoidance of unintended pregnancy and contraceptive alternatives. - Stressed the importance of regular exercise - Injury prevention: Discussed safety belts, safety helmets, smoke detector, smoking near bedding or upholstery.  - Dental health: Discussed importance of regular tooth brushing, flossing, and dental visits.  NEXT PREVENTATIVE PHYSICAL DUE IN 1 YEAR. - CBC with Differential/Platelet - Comprehensive metabolic panel with GFR - Hemoglobin A1c - Lipid panel - TSH Rfx on Abnormal to Free T4  2. Screening for cervical cancer Patient is agreeable to procedure today. Clinical breast exam completed with no abnormalities noted. No skin changes, nipple discharge, masses/lumps, or tenderness present. No enlarged axillary lymph nodes palpated. Advised patient to perform breast self-examination monthly during menstrual cycle.  Visual inspection of external genitalia of vagina. No inguinal lymphadenopathy palpated. Vaginal walls and cervix appear pink during speculum examination. Cervical os visualized. Menstrual blood present during exam- attempted to clean blood from cervix with large cotton swab. Pap completed with no complications. Will update patient with results.  - Cytology - PAP  3. Screening for colorectal cancer Patient agreeable for colorectal cancer screening.  - Amb Referral to Colonoscopy  4. Uncontrolled type 2 diabetes mellitus with hyperglycemia (HCC) Will obtain labs today.  - Comprehensive metabolic panel with GFR - Hemoglobin A1c - sitaGLIPtin  (JANUVIA ) 25 MG tablet; Take 1 tablet (25 mg total) by mouth daily.  Dispense: 30 tablet; Refill: 2 - glipiZIDE  (GLUCOTROL ) 5 MG tablet; Take 1 tablet (5 mg total) by mouth daily before breakfast.  Dispense: 30 tablet; Refill: 2 - Continuous Glucose Sensor (DEXCOM G6 SENSOR) MISC; Change sensor every 10 days.  Dispense: 3 each; Refill: 3  5. Leukocytosis, unspecified type Will obtain labs today.  - CBC with Differential/Platelet  6. Hyperlipidemia associated with type 2 diabetes mellitus (HCC) Will obtain labs today.  - Lipid panel  7. Thyroid  disorder screening Will obtain labs today.  - TSH Rfx on Abnormal to Free T4  8. Encounter for screening mammogram for malignant neoplasm of breast Agreeable to complete breast cancer screening.  - MM 3D DIAGNOSTIC MAMMOGRAM BILATERAL BREAST; Future  9. Vitamin D  deficiency Will obtain labs today.  - Cholecalciferol  (VITAMIN D ) 50 MCG (2000 UT) tablet; Take 1 tablet (2,000 Units total) by mouth daily.  Dispense: 30 tablet; Refill: 2   Return in about 3 months (around 04/21/2024) for Diabetes f/u.  Patient to reach out to office if new, worrisome, or unresolved symptoms arise or if no improvement in patient's condition. Patient verbalized understanding and is agreeable to treatment plan. All questions  answered to patient's satisfaction.   Evalene Arts, FNP

## 2024-01-21 LAB — HEMOGLOBIN A1C
Est. average glucose Bld gHb Est-mCnc: 143 mg/dL
Hgb A1c MFr Bld: 6.6 % — ABNORMAL HIGH (ref 4.8–5.6)

## 2024-01-21 LAB — CBC WITH DIFFERENTIAL/PLATELET
Basophils Absolute: 0 x10E3/uL (ref 0.0–0.2)
Basos: 0 %
EOS (ABSOLUTE): 0.1 x10E3/uL (ref 0.0–0.4)
Eos: 1 %
Hematocrit: 34.4 % (ref 34.0–46.6)
Hemoglobin: 11.2 g/dL (ref 11.1–15.9)
Immature Grans (Abs): 0 x10E3/uL (ref 0.0–0.1)
Immature Granulocytes: 0 %
Lymphocytes Absolute: 2.7 x10E3/uL (ref 0.7–3.1)
Lymphs: 34 %
MCH: 27.9 pg (ref 26.6–33.0)
MCHC: 32.6 g/dL (ref 31.5–35.7)
MCV: 86 fL (ref 79–97)
Monocytes Absolute: 0.6 x10E3/uL (ref 0.1–0.9)
Monocytes: 7 %
Neutrophils Absolute: 4.6 x10E3/uL (ref 1.4–7.0)
Neutrophils: 58 %
Platelets: 293 x10E3/uL (ref 150–450)
RBC: 4.02 x10E6/uL (ref 3.77–5.28)
RDW: 13.9 % (ref 11.7–15.4)
WBC: 8 x10E3/uL (ref 3.4–10.8)

## 2024-01-21 LAB — COMPREHENSIVE METABOLIC PANEL WITH GFR
ALT: 20 IU/L (ref 0–32)
AST: 22 IU/L (ref 0–40)
Albumin: 4.3 g/dL (ref 3.8–4.9)
Alkaline Phosphatase: 107 IU/L (ref 44–121)
BUN/Creatinine Ratio: 17 (ref 9–23)
BUN: 16 mg/dL (ref 6–24)
Bilirubin Total: 0.3 mg/dL (ref 0.0–1.2)
CO2: 21 mmol/L (ref 20–29)
Calcium: 9.7 mg/dL (ref 8.7–10.2)
Chloride: 103 mmol/L (ref 96–106)
Creatinine, Ser: 0.96 mg/dL (ref 0.57–1.00)
Globulin, Total: 3.5 g/dL (ref 1.5–4.5)
Glucose: 77 mg/dL (ref 70–99)
Potassium: 4.7 mmol/L (ref 3.5–5.2)
Sodium: 139 mmol/L (ref 134–144)
Total Protein: 7.8 g/dL (ref 6.0–8.5)
eGFR: 69 mL/min/1.73 (ref >59)

## 2024-01-21 LAB — TSH RFX ON ABNORMAL TO FREE T4: TSH: 1.33 u[IU]/mL (ref 0.450–4.500)

## 2024-01-21 LAB — LIPID PANEL
Chol/HDL Ratio: 5 ratio — ABNORMAL HIGH (ref 0.0–4.4)
Cholesterol, Total: 246 mg/dL — ABNORMAL HIGH (ref 100–199)
HDL: 49 mg/dL (ref >39)
LDL Chol Calc (NIH): 171 mg/dL — ABNORMAL HIGH (ref 0–99)
Triglycerides: 144 mg/dL (ref 0–149)
VLDL Cholesterol Cal: 26 mg/dL (ref 5–40)

## 2024-01-22 ENCOUNTER — Telehealth: Payer: Self-pay

## 2024-01-22 ENCOUNTER — Other Ambulatory Visit: Payer: Self-pay

## 2024-01-22 ENCOUNTER — Ambulatory Visit: Payer: Self-pay | Admitting: Family Medicine

## 2024-01-22 NOTE — Telephone Encounter (Signed)
(  [facility] ROOM)  PA Case ID #: [id number]  Rx #: [id number]  Need Help? Call us  at [phone]  Status Sent to Plan today  Drug Dexcom G6 Sensor  Form MedImpact ePA Form 2017 NCPDP

## 2024-01-23 LAB — CYTOLOGY - PAP
Comment: NEGATIVE
Diagnosis: NEGATIVE
Diagnosis: REACTIVE
High risk HPV: NEGATIVE

## 2024-02-07 ENCOUNTER — Ambulatory Visit: Admitting: Nurse Practitioner

## 2024-02-07 ENCOUNTER — Other Ambulatory Visit: Payer: Self-pay

## 2024-02-07 MED ORDER — DEXCOM G6 TRANSMITTER MISC
99 refills | Status: AC
  Filled 2024-02-07: qty 1, 90d supply, fill #0

## 2024-02-07 MED ORDER — DEXCOM G6 RECEIVER DEVI: 99 refills | Status: AC

## 2024-02-25 ENCOUNTER — Other Ambulatory Visit: Payer: Self-pay

## 2024-04-21 ENCOUNTER — Encounter: Payer: Self-pay | Admitting: Family Medicine

## 2024-04-21 ENCOUNTER — Other Ambulatory Visit: Payer: Self-pay

## 2024-04-21 ENCOUNTER — Ambulatory Visit (INDEPENDENT_AMBULATORY_CARE_PROVIDER_SITE_OTHER): Admitting: Family Medicine

## 2024-04-21 VITALS — BP 155/92 | HR 85 | Resp 16 | Ht 63.0 in | Wt 131.4 lb

## 2024-04-21 DIAGNOSIS — E1165 Type 2 diabetes mellitus with hyperglycemia: Secondary | ICD-10-CM | POA: Diagnosis not present

## 2024-04-21 DIAGNOSIS — I152 Hypertension secondary to endocrine disorders: Secondary | ICD-10-CM

## 2024-04-21 DIAGNOSIS — Z7984 Long term (current) use of oral hypoglycemic drugs: Secondary | ICD-10-CM

## 2024-04-21 LAB — POCT GLYCOSYLATED HEMOGLOBIN (HGB A1C): Hemoglobin A1C: 6.2 % — AB (ref 4.0–5.6)

## 2024-04-21 MED ORDER — LISINOPRIL 5 MG PO TABS
5.0000 mg | ORAL_TABLET | Freq: Every day | ORAL | 3 refills | Status: DC
  Filled 2024-04-21: qty 90, 90d supply, fill #0

## 2024-04-21 NOTE — Patient Instructions (Signed)

## 2024-04-21 NOTE — Progress Notes (Signed)
   Established Patient Office Visit  Subjective  Patient ID: Paula Mcmahon, female    DOB: [birth year]  Age: 58 y.o. MRN: [id number]  CC: Diabetes Follow-Up   Discussed the use of AI scribe software for clinical note transcription with the patient, who gave verbal consent to proceed.  History of Present Illness    Paula Mcmahon is a 58 year old female with diabetes who presents for a follow-up visit.  She is currently managing her diabetes with Januvia  25 mg daily and Glipizide  5 mg. Her last A1c was noted to be good at the last visit, about 3 months ago, with results of 6.6%.  At home, she reports her systolic BPs are around 110-173mmHg and diastolic blood pressure typically ranges from the 70s to low 80s. She checks her blood pressure infrequently. Denies chest pain, headaches, or shortness of breath.  ROS: see HPI   Objective:    BP (!) 155/92   Pulse 85   Resp 16   Ht 5' 3 (1.6 m)   Wt 131 lb 6.4 oz (59.6 kg)   LMP 12/30/2017 Comment: not preg per pt  SpO2 96%   BMI 23.28 kg/m  BP Readings from Last 3 Encounters:  04/21/24 (!) 155/92  01/20/24 (!) 148/97  10/29/23 138/87    Physical Exam Vitals reviewed.  Constitutional:      Appearance: Normal appearance.  Cardiovascular:     Rate and Rhythm: Normal rate and regular rhythm.     Pulses: Normal pulses.     Heart sounds: Normal heart sounds.  Pulmonary:     Effort: Pulmonary effort is normal.     Breath sounds: Normal breath sounds.  Neurological:     Mental Status: She is alert.  Psychiatric:        Mood and Affect: Mood normal.        Behavior: Behavior normal.     Assessment & Plan:   1. Uncontrolled type 2 diabetes mellitus with hyperglycemia (HCC) (Primary) POCT A1c 6.2% in office. Continue Januvia  25 mg daily and continue glipizide  5 mg daily. Discussed lifestyle modifications- including healthy nutrition and daily exercise.  - POCT HgB A1C - lisinopril (ZESTRIL) 5 MG tablet; Take 1 tablet (5 mg total)  by mouth daily.  Dispense: 90 tablet; Refill: 3  2. Hypertension due to endocrine disorder Blood pressure readings in office elevated. Discussed potential benefit of lisinopril for kidney protection and blood pressure management in diabetes. Rechecked blood pressure before leaving. Advised patient benefits of starting lisinopril and she is agreeable. Advised her to check blood pressure readings about 60 minutes after taking her medication. Rx sent to pharmacy on file.  - lisinopril (ZESTRIL) 5 MG tablet; Take 1 tablet (5 mg total) by mouth daily.  Dispense: 90 tablet; Refill: 3   Return in about 2 weeks (around 05/05/2024) for HTN follow-up.    Evalene Arts, FNP

## 2024-04-23 ENCOUNTER — Other Ambulatory Visit (HOSPITAL_COMMUNITY): Payer: Self-pay

## 2024-04-28 ENCOUNTER — Other Ambulatory Visit: Payer: Self-pay

## 2024-05-05 ENCOUNTER — Ambulatory Visit: Admitting: Family Medicine

## 2024-05-05 ENCOUNTER — Encounter: Payer: Self-pay | Admitting: Family Medicine

## 2024-05-05 ENCOUNTER — Other Ambulatory Visit: Payer: Self-pay

## 2024-05-05 VITALS — BP 136/84 | HR 78 | Resp 16 | Wt 136.0 lb

## 2024-05-05 DIAGNOSIS — Z7984 Long term (current) use of oral hypoglycemic drugs: Secondary | ICD-10-CM

## 2024-05-05 DIAGNOSIS — I152 Hypertension secondary to endocrine disorders: Secondary | ICD-10-CM | POA: Diagnosis not present

## 2024-05-05 DIAGNOSIS — E1165 Type 2 diabetes mellitus with hyperglycemia: Secondary | ICD-10-CM

## 2024-05-05 MED ORDER — LISINOPRIL 2.5 MG PO TABS
2.5000 mg | ORAL_TABLET | Freq: Every day | ORAL | 2 refills | Status: AC
  Filled 2024-05-05: qty 30, 30d supply, fill #0

## 2024-05-05 NOTE — Progress Notes (Signed)
 Established Patient Office Visit  Subjective  Patient ID: Paula Mcmahon, female    DOB: Aug 01, 1965  Age: 58 y.o. MRN: [id number]  CC: HTN  Discussed the use of AI scribe software for clinical note transcription with the patient, who gave verbal consent to proceed.  History of Present Illness    Paula Mcmahon is a 58 year old female with hypertension and diabetes who presents for a blood pressure follow-up.  Her previous blood pressure reading in office was 155/92 mmHg and her current reading is 136/80 mmHg. However, she notes that her home blood pressure readings have been running low, and she experiences symptoms such as dizziness and lightheadedness, particularly when busy or after taking her medication. She recalls a specific incident over the weekend where she felt like she might pass out, though she cannot remember the exact timing of her medication that day.  She takes lisinopril  5mg  daily in the morning and sometimes notices her blood pressure is higher during the day and lower in the evenings. She is concerned that her home blood pressure cuff might be inaccurate, as it is older and possibly too large.  She tries to eat breakfast and lunch but often has a long gap between meals, which affects her blood sugar levels. She acknowledges that she needs to drink more water, as she often forgets to stay hydrated during busy workdays.  No other symptoms are reported apart from feeling dizzy and lightheaded at times, particularly when her blood pressure is low. Denies current headache, dizziness, CP, SHOB, vision changes.      Patient is regularly keeping a check on BP at home (in the evenings): 96/53, 91/59, 81/46, 75/45, 75/50, 77/50, 109/76, 97/26, 97/52, 88/60, 91/42, 105/68  BP Readings from Last 3 Encounters:  05/05/24 136/84  04/21/24 (!) 155/92  01/20/24 (!) 148/97   ROS: see HPI   Objective:    BP 136/84   Pulse 78   Resp 16   Wt 136 lb (61.7 kg)   LMP 12/30/2017 Comment:  not preg per pt  SpO2 96%   BMI 24.09 kg/m  BP Readings from Last 3 Encounters:  05/05/24 136/84  04/21/24 (!) 155/92  01/20/24 (!) 148/97    Physical Exam Vitals reviewed.  Constitutional:      Appearance: Normal appearance.  Cardiovascular:     Rate and Rhythm: Normal rate and regular rhythm.     Pulses: Normal pulses.     Heart sounds: Normal heart sounds.  Pulmonary:     Effort: Pulmonary effort is normal.     Breath sounds: Normal breath sounds.  Musculoskeletal:     Right lower leg: No edema.     Left lower leg: No edema.  Neurological:     Mental Status: She is alert.  Psychiatric:        Mood and Affect: Mood normal.        Behavior: Behavior normal.     Assessment & Plan:   1. Hypertension due to endocrine disorder (Primary) Well-controlled with lisinopril . Office reading 136/80 mmHg, improved from 155/92 mmHg. Home readings lower, possibly due to inaccurate cuff or white coat hypertension. Dizziness and lightheadedness suggest overmedication. Decreased lisinopril  from 5mg  to 2.5mg  daily. Advised to compare home blood pressure cuff with office machine at next visit or purchase an accurate cuff from [email or web address]. Encouraged increased water intake and consumption of salty foods if experiencing low blood pressure symptoms. Scheduled follow-up in 4 weeks to assess response to adjusted lisinopril  dose. -  lisinopril  (ZESTRIL ) 2.5 MG tablet; Take 1 tablet (2.5 mg total) by mouth daily.  Dispense: 30 tablet; Refill: 2  2. Uncontrolled type 2 diabetes mellitus with hyperglycemia (HCC) Blood sugar levels drop between breakfast and lunch due to long gaps between meals. Encouraged regular meal intervals to prevent blood sugar drops.  - lisinopril  (ZESTRIL ) 2.5 MG tablet; Take 1 tablet (2.5 mg total) by mouth daily.  Dispense: 30 tablet; Refill: 2    Return in about 4 weeks (around 06/02/2024) for HTN follow-up.    Evalene Arts, FNP

## 2024-05-05 NOTE — Patient Instructions (Addendum)
 Continue to check your blood pressure and record the readings Bring in your blood pressure cuff to compare to our machine If your blood pressure is consistently low (90/60 or less), please return to the office sooner than your scheduled follow-up or send me a MyChart message.   Accurate blood pressure cuffs:  [email or web address]   MyChart:  For all urgent or time sensitive needs we ask that you please call the office to avoid delays. Our number is [phone]) [phone]. MyChart is not constantly monitored and due to the large volume of messages a day, replies may take up to 72 business hours.   MyChart Policy: MyChart allows for you to see your visit notes, after visit summary, provider recommendations, lab and tests results, make an appointment, request refills, and contact your provider or the office for non-urgent questions or concerns. Providers are seeing patients during normal business hours and do not have built in time to review MyChart messages.  We ask that you allow a minimum of 3 business days for responses to [place]. For this reason, please do not send urgent requests through MyChart. Please call the office at [phone]. New and ongoing conditions may require a visit. We have virtual and in person visit available for your convenience.  Complex MyChart concerns may require a visit. Your provider may request you schedule a virtual or in person visit to ensure we are providing the best care possible. MyChart messages sent after 11:00 AM on Friday will not be received by the provider until Monday morning.    Lab and Test Results: You will receive your lab and test results on MyChart as soon as they are completed and results have been sent by the lab or testing facility. Due to this service, you will receive your results BEFORE your provider.  I review lab and tests results each morning prior to seeing patients. Some results require collaboration with  other providers to ensure you are receiving the most appropriate care. For this reason, we ask that you please allow a minimum of 3-5 business days from the time the ALL results have been received for your provider to receive and review lab and test results and contact you about these.  Most lab and test result comments from the provider will be sent through MyChart. Your provider may recommend changes to the plan of care, follow-up visits, repeat testing, ask questions, or request an office visit to discuss these results. You may reply directly to this message or call the office at [phone] to provide information for the provider or set up an appointment. In some instances, you will be called with test results and recommendations. Please let us  know if this is preferred and we will make note of this in your chart to provide this for you.    If you have not heard a response to your lab or test results in 5 business days from all results returning to MyChart, please call the office to let us  know. We ask that you please avoid calling prior to this time unless there is an emergent concern. Due to high call volumes, this can delay the resulting process.   After Hours: For all non-emergency after hours needs, please call the office at [phone] and select the option to reach the on-call provider service. On-call services are shared between multiple [place] offices and therefore it will not be possible to speak directly with your provider. On-call providers may provide medical advice and recommendations, but  are unable to provide refills for maintenance medications.  For all emergency or urgent medical needs after normal business hours, we recommend that you seek care at the closest Urgent Care or Emergency Department to ensure appropriate treatment in a timely manner.  [facility] [place] has a 24 hour emergency room located on the ground floor for your convenience.    Urgent  Concerns During the Business Day Providers are seeing patients from 8AM to 5PM, Monday through Thursday, and 8AM to 12PM on Friday with a busy schedule and are most often not able to respond to non-urgent calls until the end of the day or the next business day. If you should have URGENT concerns during the day, please call and speak to the nurse or schedule a same day appointment so that we can address your concern without delay.    Thank you, again, for choosing me as your health care partner. I appreciate your trust and look forward to learning more about you.    Evalene Arts, FNP-C

## 2024-05-16 ENCOUNTER — Other Ambulatory Visit: Payer: Self-pay

## 2024-05-26 ENCOUNTER — Emergency Department
Admission: EM | Admit: 2024-05-26 | Discharge: 2024-05-26 | Disposition: A | Discharge status: Home / Self Care | Attending: Emergency Medicine | Admitting: Emergency Medicine

## 2024-05-26 ENCOUNTER — Other Ambulatory Visit: Payer: Self-pay

## 2024-05-26 ENCOUNTER — Emergency Department

## 2024-05-26 ENCOUNTER — Ambulatory Visit
Admission: EM | Admit: 2024-05-26 | Discharge: 2024-05-26 | Disposition: A | Discharge status: Home / Self Care | Attending: Emergency Medicine | Admitting: Emergency Medicine

## 2024-05-26 DIAGNOSIS — E119 Type 2 diabetes mellitus without complications: Secondary | ICD-10-CM | POA: Diagnosis not present

## 2024-05-26 DIAGNOSIS — H5213 Myopia, bilateral: Secondary | ICD-10-CM | POA: Diagnosis not present

## 2024-05-26 DIAGNOSIS — L0201 Cutaneous abscess of face: Secondary | ICD-10-CM | POA: Insufficient documentation

## 2024-05-26 DIAGNOSIS — L03211 Cellulitis of face: Secondary | ICD-10-CM | POA: Diagnosis not present

## 2024-05-26 DIAGNOSIS — R21 Rash and other nonspecific skin eruption: Secondary | ICD-10-CM | POA: Diagnosis not present

## 2024-05-26 DIAGNOSIS — K089 Disorder of teeth and supporting structures, unspecified: Secondary | ICD-10-CM | POA: Diagnosis not present

## 2024-05-26 DIAGNOSIS — J340 Abscess, furuncle and carbuncle of nose: Secondary | ICD-10-CM | POA: Diagnosis not present

## 2024-05-26 LAB — CBC WITH DIFFERENTIAL/PLATELET
Abs Immature Granulocytes: 0.07 K/uL (ref 0.00–0.07)
Basophils Absolute: 0 K/uL (ref 0.0–0.1)
Basophils Relative: 0 %
Eosinophils Absolute: 0.1 K/uL (ref 0.0–0.5)
Eosinophils Relative: 0 %
HCT: 33.9 % — ABNORMAL LOW (ref 36.0–46.0)
Hemoglobin: 11 g/dL — ABNORMAL LOW (ref 12.0–15.0)
Immature Granulocytes: 1 %
Lymphocytes Relative: 17 %
Lymphs Abs: 2.4 K/uL (ref 0.7–4.0)
MCH: 27.4 pg (ref 26.0–34.0)
MCHC: 32.4 g/dL (ref 30.0–36.0)
MCV: 84.5 fL (ref 80.0–100.0)
Monocytes Absolute: 0.8 K/uL (ref 0.1–1.0)
Monocytes Relative: 6 %
Neutro Abs: 10.4 K/uL — ABNORMAL HIGH (ref 1.7–7.7)
Neutrophils Relative %: 76 %
Platelets: 283 K/uL (ref 150–400)
RBC: 4.01 MIL/uL (ref 3.87–5.11)
RDW: 12.4 % (ref 11.5–15.5)
WBC: 13.7 K/uL — ABNORMAL HIGH (ref 4.0–10.5)
nRBC: 0 % (ref 0.0–0.2)

## 2024-05-26 LAB — COMPREHENSIVE METABOLIC PANEL WITH GFR
ALT: 40 U/L (ref 0–44)
AST: 28 U/L (ref 15–41)
Albumin: 4.3 g/dL (ref 3.5–5.0)
Alkaline Phosphatase: 163 U/L — ABNORMAL HIGH (ref 38–126)
Anion gap: 13 (ref 5–15)
BUN: 11 mg/dL (ref 6–20)
CO2: 23 mmol/L (ref 22–32)
Calcium: 9.9 mg/dL (ref 8.9–10.3)
Chloride: 102 mmol/L (ref 98–111)
Creatinine, Ser: 0.89 mg/dL (ref 0.44–1.00)
GFR, Estimated: >60 mL/min
Glucose, Bld: 130 mg/dL — ABNORMAL HIGH (ref 70–99)
Potassium: 4.2 mmol/L (ref 3.5–5.1)
Sodium: 137 mmol/L (ref 135–145)
Total Bilirubin: 0.4 mg/dL (ref 0.0–1.2)
Total Protein: 8.2 g/dL — ABNORMAL HIGH (ref 6.5–8.1)

## 2024-05-26 MED ORDER — TETRACAINE HCL 0.5 % OP SOLN: 1.0000 [drp] | Freq: Once | OPHTHALMIC | Status: DC

## 2024-05-26 MED ORDER — IOHEXOL 300 MG/ML  SOLN
75.0000 mL | Freq: Once | INTRAMUSCULAR | Status: AC | PRN
  Administered 2024-05-26: 75 mL via INTRAVENOUS

## 2024-05-26 MED ORDER — IBUPROFEN 600 MG PO TABS
600.0000 mg | ORAL_TABLET | Freq: Four times a day (QID) | ORAL | 0 refills | Status: AC | PRN
  Filled 2024-05-26: qty 20, 5d supply, fill #0

## 2024-05-26 MED ORDER — FLUORESCEIN SODIUM 1 MG OP STRP: 1.0000 | ORAL_STRIP | Freq: Once | OPHTHALMIC | Status: DC

## 2024-05-26 MED ORDER — DOXYCYCLINE HYCLATE 100 MG PO CAPS
100.0000 mg | ORAL_CAPSULE | Freq: Two times a day (BID) | ORAL | 0 refills | Status: AC
  Filled 2024-05-26: qty 14, 7d supply, fill #0

## 2024-05-26 MED ORDER — VALACYCLOVIR HCL 1 G PO TABS
1000.0000 mg | ORAL_TABLET | Freq: Three times a day (TID) | ORAL | 0 refills | Status: AC
  Filled 2024-05-26: qty 21, 7d supply, fill #0

## 2024-05-26 MED ORDER — OXYCODONE-ACETAMINOPHEN 5-325 MG PO TABS
1.0000 | ORAL_TABLET | Freq: Once | ORAL | Status: AC
  Administered 2024-05-26: 1 via ORAL
  Filled 2024-05-26: qty 1

## 2024-05-26 MED ORDER — AMOXICILLIN-POT CLAVULANATE 875-125 MG PO TABS
1.0000 | ORAL_TABLET | Freq: Two times a day (BID) | ORAL | 0 refills | Status: DC
  Filled 2024-05-26: qty 14, 7d supply, fill #0

## 2024-05-26 NOTE — ED Provider Notes (Signed)
 "  [facility] Provider Note    Event Date/Time   First MD Initiated Contact with Patient 05/26/24 1317     (approximate)   History   Abscess   HPI  Paula Mcmahon is a 58 y.o. female   presents to the ED with complaint of abscess multiple areas of her face.  Patient states that this started 2 days ago.  She denies any fever or chills.  Patient went to urgent care and was seen.  She was given a prescription for Augmentin  and doxycycline  which she has not started.  Patient was also given a prescription for Valtrex  which also was at the pharmacy and has not been started.  Patient has history of diabetes, neuromuscular disorder, anemia.      Physical Exam   Triage Vital Signs: ED Triage Vitals  Encounter Vitals Group     BP 05/26/24 1143 (P) 137/88     Girls Systolic BP Percentile --      Girls Diastolic BP Percentile --      Boys Systolic BP Percentile --      Boys Diastolic BP Percentile --      Pulse Rate 05/26/24 1143 (P) 89     Resp 05/26/24 1143 (P) 18     Temp 05/26/24 1143 (P) 98.4 F (36.9 C)     Temp Source 05/26/24 1143 (P) Oral     SpO2 05/26/24 1143 (P) 100 %     Weight 05/26/24 1142 121 lb (54.9 kg)     Height 05/26/24 1142 5' 3 (1.6 m)     Head Circumference --      Peak Flow --      Pain Score 05/26/24 1145 5     Pain Loc --      Pain Education --      Exclude from Growth Chart --     Most recent vital signs: Vitals:   05/26/24 1143  BP: (P) 137/88  Pulse: (P) 89  Resp: (P) 18  Temp: (P) 98.4 F (36.9 C)  SpO2: (P) 100%     General: Awake, no distress.  Alert, talkative, able to answer questions appropriately CV:  Good peripheral perfusion.  Resp:  Normal effort.  Abd:  No distention.  Other:  Left forehead there is a superficial 1 cm in diameter area with minimal erythema.  No drainage at this area.  To the left cheek area patient has a another superficial area measuring approximately 2 cm in diameter very similar to  the area on her forehead without active drainage and minimal tenderness on palpation.  The area most concerning is 1 to the bridge of her nose and left lateral area that is erythematous and moderately tender to palpation.  No active drainage or skin lesions is noted.   ED Results / Procedures / Treatments   Labs (all labs ordered are listed, but only abnormal results are displayed) Labs Reviewed  CBC WITH DIFFERENTIAL/PLATELET - Abnormal; Notable for the following components:      Result Value   WBC 13.7 (*)    Hemoglobin 11.0 (*)    HCT 33.9 (*)    Neutro Abs 10.4 (*)    All other components within normal limits  COMPREHENSIVE METABOLIC PANEL WITH GFR - Abnormal; Notable for the following components:   Glucose, Bld 130 (*)    Total Protein 8.2 (*)    Alkaline Phosphatase 163 (*)    All other components within normal limits  RADIOLOGY CT maxillofacial with contrast pending.    PROCEDURES:  Critical Care performed:   Procedures   MEDICATIONS ORDERED IN ED: Medications  oxyCODONE -acetaminophen  (PERCOCET/ROXICET) 5-325 MG per tablet 1 tablet (1 tablet Oral Given 05/26/24 1505)  iohexol  (OMNIPAQUE ) 300 MG/ML solution 75 mL (75 mLs Intravenous Contrast Given 05/26/24 1509)     IMPRESSION / MDM / ASSESSMENT AND PLAN / ED COURSE  I reviewed the triage vital signs and the nursing notes.   Differential diagnosis includes, but is not limited to, multiple facial abscesses, cellulitis, infected insect bites, herpes zoster considered but unlikely, early periorbital cellulitis, septal cellulitis.  ----------------------------------------- 3:25 PM on 05/26/2024 ----------------------------------------- Discussed this patient with Kirk Landry Gentry, FNP who will take over the care of this patient.  Currently we are waiting on CT for evaluation of the areas on her face.  Patient was given Percocet for pain prior to her CT scan.      Patient's presentation is most consistent  with acute illness / injury with system symptoms.  FINAL CLINICAL IMPRESSION(S) / ED DIAGNOSES   Final diagnoses:  Facial abscess     Rx / DC Orders   ED Discharge Orders     None        Note:  This document was prepared using Dragon voice recognition software and may include unintentional dictation errors.   Saunders Shona CROME, PA-C 05/26/24 1526    Bradler, Evan K, MD 05/28/24 1455  "

## 2024-05-26 NOTE — ED Triage Notes (Signed)
 Pt comes with c/o abscess to top of nose. Pt states left eye swelling also. Pt states this started two days ago. Pt went to UC and was seen. They prescribed pt meds but wanted her  to come to ED to get seen too.

## 2024-05-26 NOTE — Discharge Instructions (Addendum)
 I am concerned that you have an abscess on your face that could easily extend into the eye socket.  I am concerned that this may need more than oral antibiotics.  This could also be shingles.  Unfortunately we do not have CT here available today to rule out postseptal cellulitis or facial abscess.  It is perfectly fine to go to the emergency department now to rule this out, or you can try the doxycycline , Augmentin  and Valtrex  and see how you do.  If you are not better in 24 hours or if you get worse in the interim, you must go to the emergency department.  You can take Tylenol  1000 mg combined with ibuprofen  3-4 times a day as needed for pain.  You also need glasses.  Please follow-up with an eye doctor of your choice to be fitted for corrective lenses.

## 2024-05-26 NOTE — ED Triage Notes (Signed)
 Patient presents to UC for possible abscess on right bridge of nose, left eye swelling since this morning. Reports using warm compresses with no improvement. States she has had them appear on other parts of her face but resolve on their own.

## 2024-05-26 NOTE — ED Provider Notes (Signed)
----------------------------------------- °  3:15 PM on 05/26/2024 -----------------------------------------  Blood pressure (P) 137/88, pulse (P) 89, temperature (P) 98.4 F (36.9 C), temperature source (P) Oral, resp. rate (P) 18, height 5' 3 (1.6 m), weight 54.9 kg, last menstrual period 12/30/2017, SpO2 (P) 100%.  Assuming care from Shona Gin, PA-C.  In short, Paula Mcmahon is a 59 y.o. female with a chief complaint of facial swelling and raised, tender area on bridge of nose.  Refer to the original H&P for additional details.  The current plan of care is to await results of CT.  ----------------------------------------- 5:17 PM on 05/26/2024 ----------------------------------------- CT shows cellulitis within the soft tissue, but no deep space infection. Prescriptions for Doxycycline , Augmentin , and valtrex  already submitted to the pharmacy. Patient advised she does not need the valtrex  or augmentin , but will take the doxycycline  as prescribed. Warm compresses over the area advised. Outpatient follow up with primary care encouraged and ER return precautions discussed.     Medications  oxyCODONE -acetaminophen  (PERCOCET/ROXICET) 5-325 MG per tablet 1 tablet (1 tablet Oral Given 05/26/24 1505)  iohexol  (OMNIPAQUE ) 300 MG/ML solution 75 mL (75 mLs Intravenous Contrast Given 05/26/24 1509)     ED Discharge Orders     None      Final diagnoses:  Facial cellulitis      [name], FNP 05/26/24 1720    Jossie Artist POUR, MD 05/28/24 1455

## 2024-05-26 NOTE — Discharge Instructions (Signed)
 Take the doxycycline  and ibuprofen  as prescribed.  Use warm compress over the area off-and-on throughout the next couple of days.  See your primary care provider if you are not improving over the next couple of days.  If you are unable to schedule an appointment and your symptoms get worse please return to the emergency department.

## 2024-05-26 NOTE — ED Provider Notes (Signed)
 HPI  SUBJECTIVE:  Paula Mcmahon is a 58 y.o. female who presents with nasal congestion, sinus drainage, rhinorrhea starting last week that is getting better.  She reports having an erythematous, painful lesion show up on her forehead and left cheek earlier this week.  She reports a red bump on her nose starting 3 days ago that has gotten bigger in size, more erythematous and painful.  She reports tenderness along the left nasal bridge and states that her left eye was swollen shut this morning.  She reports some periorbital tenderness.  No trauma to the area, known insect bite.  No fevers, photophobia, pain with EOMs.  She reports change in her vision in left eye but attributes this to eye swelling.  She states that her vision is fine when she opens her eye fully.  She reports a bad headache on the left side.  She states symptoms were preceded by headache.  There were not preceded by any paresthesias.  She has never had symptoms like this before.  She has been taking Tylenol  1000 mg 2 or 3 times a day, last dose was within the past 6 hours.  She has also tried warm compresses with improvement in her symptoms.  No ongoing factors.  She has a past medical history of diabetes, hyperlipidemia, hypertension, varicella.  No history of MRSA or shingles.  PCP: Cloretta Haring  Past Medical History:  Diagnosis Date   Anemia    H/O PREGNANCY   Diabetes mellitus without complication (HCC)    type 2   Elbow fracture 07/23/2014   Family history of BRCA2 gene positive    Family history of breast cancer    Family history of colon cancer    Family history of lung cancer    Fracture of arm, left, multiple, closed    Frozen shoulder 2016   LEFT   Genetic testing 07/17/2019   Negative genetic testing. No pathogenic variants identified. 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 The report date is 07/16/2019.      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    Pain provoked by trauma 02/18/2015   Shoulder disorder    frozen left shoulder   Vitamin D  deficiency     Past Surgical History:  Procedure Laterality Date   BREAST BIOPSY Left 01/28/2015   negative, Dr Guinevere office   BREAST BIOPSY Left 03/10/2015   Procedure: BREAST BIOPSY WITH NEEDLE LOCALIZATION;  Surgeon: Louanne KANDICE Muse, MD;  Location: [facility] ORS;  Service: General;  Laterality: Left;   BREAST BIOPSY Right 01/30/2016   benign, Dr Dessa Lona   BREAST EXCISIONAL BIOPSY Left 03/10/2015   CHOLECYSTECTOMY  1998   EXTERNAL FIXATION ARM  07/2014   left arm    FRACTURE SURGERY  07/2014   IRRIGATION AND DEBRIDEMENT SEBACEOUS CYST  1988   Right Shoulder    Family History  Problem Relation Age of Onset   Cancer Maternal Grandmother    Diabetes Maternal Grandmother    Breast cancer Maternal Grandmother        dx 29s   Hyperlipidemia Father    Diabetes Father    Diabetes Sister    Diabetes Brother  Juvenile diabetes   Colon cancer Paternal Aunt        dx 74s, d. 83s   Cancer Paternal Aunt    Lung cancer Maternal Grandfather    Cancer Paternal Grandmother        d. 6s, unk type   Cancer Paternal Grandfather        unk type   Diabetes Brother     Social History[1]  Current Medications[2]  Allergies[3]   ROS  As noted in HPI.   Physical Exam  BP (!) 140/83 (BP Location: Left Arm)   Pulse 86   Temp 98.3 F (36.8 C) (Oral)   Resp 16   LMP 12/30/2017 Comment: not preg per pt  SpO2 98%   Constitutional: Well developed, well nourished, no acute distress Eyes:  EOMI, conjunctiva normal bilaterally PERRLA.  No direct or consensual photophobia.  No dendrites seen on fluorescein   exam   Visual Acuity  Right Eye Distance: 20/70 Left Eye Distance: 20/70 Bilateral Distance: 20/20 (w/o correction)  Right Eye Near:   Left Eye Near:    Bilateral Near:      Positive left-sided tender periorbital erythema, edema. HENT: Normocephalic, atraumatic,mucus membranes moist.  Left EAC normal. No other tenderness over the left face. 2 x 2.5 tender erythema edema on nasal bridge.  Tenderness along left nasal bridge. 0.5 x 0.75 tender lesion left forehead 2 x 2 cm tender area of induration on left cheek.         Respiratory: Normal inspiratory effort Cardiovascular: Normal rate GI: nondistended skin: No rash, skin intact Musculoskeletal: no deformities Neurologic: Alert & oriented x 3, no focal neuro deficits Psychiatric: Speech and behavior appropriate   ED Course   Medications  fluorescein  ophthalmic strip 1 strip (has no administration in time range)  tetracaine  (PONTOCAINE) 0.5 % ophthalmic solution 1 drop (has no administration in time range)    Orders Placed This Encounter  Procedures   Visual acuity screening    Standing Status:   Standing    Number of Occurrences:   1    No results found for this or any previous visit (from the past 24 hours). No results found.  ED Clinical Impression  1. Cellulitis, face   2. Facial rash   3. Nearsightedness, bilateral      ED Assessment/Plan     Shingles, facial cellulitis, periorbital cellulitis, facial abscess, MRSA infection in the differential.  I considered postseptal cellulitis but she has evidence of ocular entrapment, no proptosis.  We do not have CT available today to evaluate for facial abscess or postseptal cellulitis.  I had a long discussion with the patient about going to the ER today to rule this out versus trying outpatient treatment with Augmentin , doxycycline  and Valtrex  with the understanding that if she does not get better in 24 hours or if she gets worse in the interim she is to go  to the ER.  I believe she has opted to go to the ER now, but I will send her home with prescriptions.  She is stable to go by private vehicle.  Also advised that she needs to get glasses.  Discussed rationale for transfer to emergency department with the patient.  She agrees to go.  Meds ordered this encounter  Medications   fluorescein  ophthalmic strip 1 strip   tetracaine  (PONTOCAINE) 0.5 % ophthalmic solution 1 drop   valACYclovir  (VALTREX ) 1000 MG tablet    Sig: Take 1 tablet (1,000 mg total) by mouth 3 (three)  times daily for 7 days.    Dispense:  21 tablet    Refill:  0   ibuprofen  (ADVIL ) 600 MG tablet    Sig: Take 1 tablet (600 mg total) by mouth every 6 (six) hours as needed.    Dispense:  20 tablet    Refill:  0   amoxicillin -clavulanate (AUGMENTIN ) 875-125 MG tablet    Sig: Take 1 tablet by mouth every 12 (twelve) hours.    Dispense:  14 tablet    Refill:  0   doxycycline  (VIBRAMYCIN ) 100 MG capsule    Sig: Take 1 capsule (100 mg total) by mouth 2 (two) times daily for 7 days.    Dispense:  14 capsule    Refill:  0      *This clinic note was created using Scientist, clinical (histocompatibility and immunogenetics). Therefore, there may be occasional mistakes despite careful proofreading.  ?     [1]  Social History Tobacco Use   Smoking status: Never    Passive exposure: Never   Smokeless tobacco: Never  Substance Use Topics   Alcohol use: No   Drug use: No  [2]  Current Facility-Administered Medications:    fluorescein  ophthalmic strip 1 strip, 1 strip, Left Eye, Once, Van Knee, MD   tetracaine  (PONTOCAINE) 0.5 % ophthalmic solution 1 drop, 1 drop, Left Eye, Once, Van Knee, MD  Current Outpatient Medications:    amoxicillin -clavulanate (AUGMENTIN ) 875-125 MG tablet, Take 1 tablet by mouth every 12 (twelve) hours., Disp: 14 tablet, Rfl: 0   doxycycline  (VIBRAMYCIN ) 100 MG capsule, Take 1 capsule (100 mg total) by mouth 2 (two) times daily for 7 days., Disp: 14 capsule, Rfl:  0   ibuprofen  (ADVIL ) 600 MG tablet, Take 1 tablet (600 mg total) by mouth every 6 (six) hours as needed., Disp: 20 tablet, Rfl: 0   valACYclovir  (VALTREX ) 1000 MG tablet, Take 1 tablet (1,000 mg total) by mouth 3 (three) times daily for 7 days., Disp: 21 tablet, Rfl: 0   Cholecalciferol  (VITAMIN D ) 50 MCG (2000 UT) tablet, Take 1 tablet (2,000 Units total) by mouth daily., Disp: 30 tablet, Rfl: 2   Continuous Glucose Receiver (DEXCOM G6 RECEIVER) DEVI, Use as directed with sensors, Disp: 1 each, Rfl: PRN   Continuous Glucose Sensor (DEXCOM G6 SENSOR) MISC, Change sensor every 10 days., Disp: 3 each, Rfl: 3   Continuous Glucose Transmitter (DEXCOM G6 TRANSMITTER) MISC, Use with sensors, Disp: 1 each, Rfl: PRN   glipiZIDE  (GLUCOTROL ) 5 MG tablet, Take 1 tablet (5 mg total) by mouth daily before breakfast., Disp: 30 tablet, Rfl: 2   lisinopril  (ZESTRIL ) 2.5 MG tablet, Take 1 tablet (2.5 mg total) by mouth daily., Disp: 30 tablet, Rfl: 2   sitaGLIPtin  (JANUVIA ) 25 MG tablet, Take 1 tablet (25 mg total) by mouth daily., Disp: 30 tablet, Rfl: 2 [3]  Allergies Allergen Reactions   Metformin  And Related Diarrhea     Van Knee, MD 05/26/24 1050

## 2024-05-28 ENCOUNTER — Encounter: Payer: Self-pay | Admitting: Family Medicine

## 2024-05-29 ENCOUNTER — Other Ambulatory Visit: Payer: Self-pay

## 2024-06-02 ENCOUNTER — Ambulatory Visit: Admitting: Family Medicine

## 2024-06-02 ENCOUNTER — Encounter: Payer: Self-pay | Admitting: Family Medicine

## 2024-06-02 ENCOUNTER — Other Ambulatory Visit: Payer: Self-pay

## 2024-06-02 VITALS — BP 160/105 | HR 83 | Resp 16 | Ht 63.0 in | Wt 121.0 lb

## 2024-06-02 DIAGNOSIS — L03211 Cellulitis of face: Secondary | ICD-10-CM

## 2024-06-02 MED ORDER — AMOXICILLIN-POT CLAVULANATE 875-125 MG PO TABS
1.0000 | ORAL_TABLET | Freq: Two times a day (BID) | ORAL | 0 refills | Status: DC
  Filled 2024-06-02: qty 14, 7d supply, fill #0

## 2024-06-02 NOTE — Progress Notes (Unsigned)
" ° °  Established Patient Office Visit  Subjective  Patient ID: Paula Mcmahon, female    DOB: Oct 12, 1965  Age: 58 y.o. MRN: [id number]  Chief Complaint  Patient presents with   Follow-up    Seen in ED    Hospital f/u 05/26/2024 for facial swelling and tenderness on nasal bridge c/b facial abscess.  She was prescribed doxycycline , Augmentin  and Valtrex . Just took doxycycline .   CT scan of maxillofacial region: Soft tissue swelling/possible cellulitis involving the nose and left medial canthus as well as left cheek with areas of potential phlegmon or developing abscess but without a clearly drainable fluid collection currently.  Week before, head congestion and sinus drainage Broke out with pimples  L eye tender    ROS    Objective:     BP (!) 175/94   Pulse 83   Resp 16   Ht 5' 3 (1.6 m)   Wt 121 lb (54.9 kg)   LMP 12/30/2017 Comment: not preg per pt  SpO2 98%   BMI 21.43 kg/m  BP Readings from Last 3 Encounters:  06/02/24 (!) 175/94  05/26/24 (P) 137/88  05/26/24 (!) 140/83     Physical Exam    Assessment & Plan:  Cellulitis of face -     Ambulatory referral to ENT  Other orders -     Amoxicillin -Pot Clavulanate; Take 1 tablet by mouth every 12 (twelve) hours.  Dispense: 14 tablet; Refill: 0     Return in about 7 weeks (around 07/24/2024) for Diabetes f/u, HTN follow-up.    Evalene Arts, FNP "

## 2024-06-05 ENCOUNTER — Encounter (INDEPENDENT_AMBULATORY_CARE_PROVIDER_SITE_OTHER): Payer: Self-pay

## 2024-06-09 ENCOUNTER — Encounter: Payer: Self-pay | Admitting: Internal Medicine

## 2024-06-24 ENCOUNTER — Other Ambulatory Visit: Payer: Self-pay

## 2024-06-25 ENCOUNTER — Encounter: Payer: Self-pay | Admitting: Pharmacist

## 2024-06-25 ENCOUNTER — Other Ambulatory Visit: Payer: Self-pay

## 2024-06-25 ENCOUNTER — Ambulatory Visit: Admitting: Family Medicine

## 2024-06-25 ENCOUNTER — Encounter: Payer: Self-pay | Admitting: Family Medicine

## 2024-06-25 VITALS — BP 162/97 | HR 85 | Resp 16 | Ht 63.0 in | Wt 127.8 lb

## 2024-06-25 DIAGNOSIS — L03116 Cellulitis of left lower limb: Secondary | ICD-10-CM | POA: Diagnosis not present

## 2024-06-25 MED ORDER — CEPHALEXIN 500 MG PO CAPS
500.0000 mg | ORAL_CAPSULE | Freq: Two times a day (BID) | ORAL | 0 refills | Status: AC
  Filled 2024-06-25: qty 28, 14d supply, fill #0

## 2024-06-25 NOTE — Progress Notes (Signed)
 "     Acute Care Office Visit  Subjective:   Paula Mcmahon 1965/11/18 06/25/2024  Chief Complaint  Patient presents with   Foot Pain   Discussed the use of AI scribe software for clinical note transcription with the patient, who gave verbal consent to proceed.  History of Present Illness   GLENDY Mcmahon is a 59 year old female who presents with concerns of a blister on her left inner ankle.  She developed a blister on her ankle approximately two days ago, which ruptured spontaneously. Following the rupture, she noticed redness extending from the blister site and swelling of the ankle. She has been attempting home treatment but is concerned about the worsening redness and potential infection.  She believes the blister may have been caused by wearing boots with certain socks, which had not previously caused any issues. The area is tender, particularly along the top of the ankle, and the redness has increased in size since onset.  She has recently been on doxycycline  and, most recently, Augmentin  for similar skin issues of the face. Currently, there is no drainage from the blister site. She is concerned about the possibility of needing emergency care if the condition worsens.       The following portions of the patient's history were reviewed and updated as appropriate: past medical history, past surgical history, family history, social history, allergies, medications, and problem list.   Patient Active Problem List   Diagnosis Date Noted   Hypertension due to endocrine disorder 05/05/2024   Uncontrolled diabetes mellitus with hyperglycemia (HCC) 10/08/2023   Family history of breast cancer    Family history of colon cancer    Family history of lung cancer    Family history of BRCA2 gene positive    Neuropathic pain of left hand 02/18/2015   Chronic pain syndrome 12/29/2014   Past Medical History:  Diagnosis Date   Anemia    H/O PREGNANCY   Diabetes mellitus without  complication (HCC)    type 2   Elbow fracture 07/23/2014   Family history of BRCA2 gene positive    Family history of breast cancer    Family history of colon cancer    Family history of lung cancer    Fracture of arm, left, multiple, closed    Frozen shoulder 2016   LEFT   Genetic testing 07/17/2019   Negative genetic testing. No pathogenic variants identified. 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 The report date is 07/16/2019.      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    Pain provoked by trauma 02/18/2015   Shoulder disorder    frozen left shoulder   Vitamin D  deficiency    Past Surgical History:  Procedure Laterality Date   BREAST BIOPSY Left 01/28/2015   negative, Dr Guinevere office   BREAST BIOPSY Left 03/10/2015   Procedure: BREAST BIOPSY WITH NEEDLE LOCALIZATION;  Surgeon: Louanne KANDICE Muse, MD;  Location: [facility] ORS;  Service: General;  Laterality: Left;   BREAST BIOPSY Right 01/30/2016   benign, Dr Dessa Lona   BREAST EXCISIONAL BIOPSY Left 03/10/2015   CHOLECYSTECTOMY  1998   EXTERNAL FIXATION ARM  07/2014  left arm    FRACTURE SURGERY  07/2014   IRRIGATION AND DEBRIDEMENT SEBACEOUS CYST  1988   Right Shoulder   Family History  Problem Relation Age of Onset   Cancer Maternal Grandmother    Diabetes Maternal Grandmother    Breast cancer Maternal Grandmother        dx 30s   Hyperlipidemia Father    Diabetes Father    Diabetes Sister    Diabetes Brother        Juvenile diabetes   Colon cancer Paternal Aunt        dx 47s, d. 93s   Cancer Paternal Aunt    Lung cancer Maternal Grandfather    Cancer Paternal Grandmother         d. 9s, unk type   Cancer Paternal Grandfather        unk type   Diabetes Brother    Outpatient Medications Prior to Visit  Medication Sig Dispense Refill   Cholecalciferol  (VITAMIN D ) 50 MCG (2000 UT) tablet Take 1 tablet (2,000 Units total) by mouth daily. 30 tablet 2   Continuous Glucose Receiver (DEXCOM G6 RECEIVER) DEVI Use as directed with sensors 1 each PRN   Continuous Glucose Sensor (DEXCOM G6 SENSOR) MISC Change sensor every 10 days. 3 each 3   Continuous Glucose Transmitter (DEXCOM G6 TRANSMITTER) MISC Use with sensors 1 each PRN   glipiZIDE  (GLUCOTROL ) 5 MG tablet Take 1 tablet (5 mg total) by mouth daily before breakfast. 30 tablet 2   ibuprofen  (ADVIL ) 600 MG tablet Take 1 tablet (600 mg total) by mouth every 6 (six) hours as needed. 20 tablet 0   lisinopril  (ZESTRIL ) 2.5 MG tablet Take 1 tablet (2.5 mg total) by mouth daily. 30 tablet 2   sitaGLIPtin  (JANUVIA ) 25 MG tablet Take 1 tablet (25 mg total) by mouth daily. 30 tablet 2   amoxicillin -clavulanate (AUGMENTIN ) 875-125 MG tablet Take 1 tablet by mouth every 12 (twelve) hours. (Patient not taking: Reported on 06/25/2024) 14 tablet 0   No facility-administered medications prior to visit.   Allergies[1]  ROS: A complete ROS was performed with pertinent positives/negatives noted in the HPI. The remainder of the ROS are negative.    Objective:   Today's Vitals   06/25/24 0933 06/25/24 0939  BP: (!) 171/101 (!) 162/97  Pulse: 85   Resp: 16   SpO2: 99%   Weight: 127 lb 12.8 oz (58 kg)   Height: 5' 3 (1.6 m)   PainSc: 0-No pain    Physical Exam Vitals reviewed.  Constitutional:      Appearance: Normal appearance.  Cardiovascular:     Rate and Rhythm: Normal rate and regular rhythm.     Pulses: Normal pulses.     Heart sounds: Normal heart sounds.  Pulmonary:     Effort: Pulmonary effort is normal.     Breath sounds: Normal breath sounds.  Skin:    Findings: Wound (picture in chart) present.  Neurological:      Mental Status: She is alert.  Psychiatric:        Mood and Affect: Mood normal.        Behavior: Behavior normal.     Assessment & Plan:    1. Cellulitis of left lower extremity (Primary) Physical exam with wound of left medial ankle with surrounding erythema and visible yellow pus at the various open areas. She does report tenderness of the area and has noticed that the redness has spread more. High risk due to history  of diabetes. Requires antibiotic therapy. Prescribed cephalexin  twice daily for 14 days. Advised to cover area when wearing shoes and keep it open to air occasionally when at home and not active. Continue using antibiotic cream once daily when changing dressing once daily. Advised patient to contact office if symptoms persist or worsen by Monday. If no improvement, may require referral to infectious disease.  - cephALEXin  (KEFLEX ) 500 MG capsule; Take 1 capsule (500 mg total) by mouth 2 (two) times daily for 14 days.  Dispense: 28 capsule; Refill: 0   Meds ordered this encounter  Medications   cephALEXin  (KEFLEX ) 500 MG capsule    Sig: Take 1 capsule (500 mg total) by mouth 2 (two) times daily for 14 days.    Dispense:  28 capsule    Refill:  0    Supervising Provider:   ZIGLAR, SUSAN K [[id number]]    Return if symptoms worsen or fail to improve.    Patient to reach out to office if new, worrisome, or unresolved symptoms arise or if no improvement in patient's condition. Patient verbalized understanding and is agreeable to treatment plan. All questions answered to patient's satisfaction.    Evalene Arts, FNP       [1]  Allergies Allergen Reactions   Metformin  And Related Diarrhea   "

## 2024-07-07 ENCOUNTER — Encounter (INDEPENDENT_AMBULATORY_CARE_PROVIDER_SITE_OTHER): Payer: Self-pay

## 2024-07-07 ENCOUNTER — Ambulatory Visit (INDEPENDENT_AMBULATORY_CARE_PROVIDER_SITE_OTHER)

## 2024-07-07 ENCOUNTER — Other Ambulatory Visit: Payer: Self-pay

## 2024-07-07 VITALS — BP 172/98 | HR 82 | Wt 127.0 lb

## 2024-07-07 DIAGNOSIS — J0110 Acute frontal sinusitis, unspecified: Secondary | ICD-10-CM | POA: Diagnosis not present

## 2024-07-07 DIAGNOSIS — J342 Deviated nasal septum: Secondary | ICD-10-CM

## 2024-07-07 DIAGNOSIS — L03211 Cellulitis of face: Secondary | ICD-10-CM | POA: Diagnosis not present

## 2024-07-07 MED ORDER — SINUS RINSE BOTTLE KIT NA PACK
1.0000 | PACK | Freq: Every day | NASAL | 1 refills | Status: AC
  Filled 2024-07-07: qty 50, 30d supply, fill #0

## 2024-07-07 NOTE — Progress Notes (Signed)
 Dear Dr. Towana, Here is my assessment for our mutual patient, Paula Mcmahon. Thank you for allowing me the opportunity to care for your patient. Please do not hesitate to contact me should you have any other questions. Sincerely, Dr. Penne Croak  Otolaryngology Clinic Note Referring provider: Dr. Towana HPI:  Discussed the use of AI scribe software for clinical note transcription with the patient, who gave verbal consent to proceed.  History of Present Illness Paula Mcmahon is a 59 year old female with recent acute sinusitis who presents for evaluation of persistent nasal congestion.  Nasal Congestion and Upper Respiratory Symptoms: - Persistent mild nasal congestion for the past week - Clear rhinorrhea and intermittent sneezing - No current facial swelling or active skin lesions - No use of nasal irrigation - Non-smoker  Facial Skin Lesions and Swelling (Resolved): - In late December, developed severe upper respiratory infection with cough and sneezing - Acute onset of approximately five distinct facial skin nodules, most severe on the nose - Large facial swelling with significant periorbital edema, resulting in inability to open her eye upon awakening - No prior similar episodes and no recollection of preceding insect bites - Evaluated at urgent care and emergency department; imaging performed - Diagnostic uncertainty regarding herpes zoster; no prior history of shingles - Treated with two courses of antibiotics, resulting in resolution of facial lesions and improvement in overall condition - Facial swelling and skin lesions have resolved   Independent Review of Additional Tests or Records:  Reviewed external note from referring PCP, Butler,describing relevant history incorporated into todays evaluation. I personally reviewed  ED notes, including pictures of cellulitis.   Indepenedent interpretation of CT max face 12/23 - mild mucosal thickening of frontal and ethmoid sinuses  bilaterally. Poor dentition. Facial swelling without hypodensity or obvious abscess.   PMH/Meds/All/SocHx/FamHx/ROS:   Past Medical History:  Diagnosis Date   Anemia    H/O PREGNANCY   Diabetes mellitus without complication (HCC)    type 2   Elbow fracture 07/23/2014   Family history of BRCA2 gene positive    Family history of breast cancer    Family history of colon cancer    Family history of lung cancer    Fracture of arm, left, multiple, closed    Frozen shoulder 2016   LEFT   Genetic testing 07/17/2019   Negative genetic testing. No pathogenic variants identified. 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 The report date is 07/16/2019.      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    Pain provoked by trauma 02/18/2015   Shoulder disorder    frozen left shoulder   Vitamin D  deficiency      Past Surgical History:  Procedure Laterality Date   BREAST BIOPSY Left 01/28/2015   negative, Dr Guinevere office   BREAST BIOPSY Left 03/10/2015   Procedure: BREAST BIOPSY WITH NEEDLE LOCALIZATION;  Surgeon: Louanne KANDICE Muse, MD;  Location: [facility] ORS;  Service: General;  Laterality: Left;   BREAST BIOPSY Right 01/30/2016   benign, Dr Dessa Lona   BREAST EXCISIONAL BIOPSY Left 03/10/2015   CHOLECYSTECTOMY  1998   EXTERNAL FIXATION ARM  07/2014   left arm  FRACTURE SURGERY  07/2014   IRRIGATION AND DEBRIDEMENT SEBACEOUS CYST  1988   Right Shoulder    Family History  Problem Relation Age of Onset   Cancer Maternal Grandmother    Diabetes Maternal Grandmother    Breast cancer Maternal Grandmother        dx 53s   Hyperlipidemia Father    Diabetes  Father    Diabetes Sister    Diabetes Brother        Juvenile diabetes   Colon cancer Paternal Aunt        dx 86s, d. 12s   Cancer Paternal Aunt    Lung cancer Maternal Grandfather    Cancer Paternal Grandmother        d. 35s, unk type   Cancer Paternal Grandfather        unk type   Diabetes Brother      Social Connections: Moderately Integrated (01/20/2024)   Social Connection and Isolation Panel    Frequency of Communication with Friends and Family: More than three times a week    Frequency of Social Gatherings with Friends and Family: More than three times a week    Attends Religious Services: More than 4 times per year    Active Member of [employer] or Organizations: Yes    Attends Engineer, Structural: More than 4 times per year    Marital Status: Divorced     Current Medications[1]   Physical Exam:   BP (!) 172/98 (BP Location: Right Arm, Patient Position: Sitting, Cuff Size: Normal) Comment: pt had just taken BP meds.  Pulse 82   Wt 127 lb (57.6 kg)   LMP 12/30/2017 Comment: not preg per pt  SpO2 98%   BMI 22.50 kg/m   The patient was awake, alert, and appropriate. The external ears were inspected, and otoscopy was performed to evaluate the external auditory canals and tympanic membranes. The nasal cavity and septum were examined for mucosal changes, obstruction, or discharge. The oral cavity and oropharynx were inspected for mucosal lesions, infection, or tonsillar hypertrophy. The neck was palpated for lymphadenopathy, thyroid  abnormalities, or other masses. Cranial nerve function was grossly intact.  Pertinent Findings: General: Well developed, well nourished. No acute distress. Voice without hoarseness Head/Face: Normocephalic. No sinus tenderness. Facial nerve intact and equal bilaterally. No facial lacerations. Eyes: PERRL, no scleral icterus or conjunctival hemorrhage. EOMI. Ears: No gross deformity. Normal external canal. Tympanic membrane with normal  landmarks bilaterally Hearing: Normal speech reception.  Nose: Mild DNS. No purulent discharge. No turbinate hypertrophy.  Mouth/Oropharynx: Lips without any lesions. Dentition poor. No mucosal lesions within the oropharynx. No tonsillar enlargement, exudate, or lesions. Pharyngeal walls symmetrical. Uvula midline. Tongue midline without lesions. Larynx: See TFL if applicable Nasopharynx: See TFL if applicable Neck: Trachea midline. No masses. No thyromegaly or nodules palpated. No crepitus. Lymphatic: No lymphadenopathy in the neck. Respiratory: No stridor or distress. Room air. Cardiovascular: Regular rate and rhythm. Extremities: No edema or cyanosis. Warm and well-perfused. Skin: No scars or lesions on face or neck. Neurologic: CN II-XII grossly intact. Moving all extremities without gross abnormality. Other:  Physical Exam HEENT: Nose normal. Forehead and cheek improved. NECK: Neck normal. Face without edema, erythema, and tenderness to palpation.       Impression & Plans:  Noel Henandez is a 59 y.o. female  1. Cellulitis of face   2. Acute non-recurrent frontal sinusitis    - Findings and diagnoses discussed in detail with the patient. - Risks, benefits, and  alternatives were reviewed. Through shared decision making, the patient elects to proceed with below.  Assessment and Plan Assessment & Plan Acute sinusitis and cellulitis  - improving.  Acute sinusitis in both frontal sinuses confirmed by imaging, resolving after antibiotics. Persistent mild nasal congestion and clear drainage. Differential included MRSA, but findings consistent with resolving sinusitis. - Ordered sinus rinse kit for nasal irrigation. - Provided instructions on nasal irrigation using distilled water and saline packets, emphasizing slow administration. - Advised follow-up with dentist as scheduled. - Recommended contacting office if symptoms persist or worsen.  Poor dentition - could have been related  to symptoms - follow up with dentist next month  - Orders placed: No orders of the defined types were placed in this encounter.  - Medications prescribed/continued/adjusted:  Meds ordered this encounter  Medications   Hypertonic Nasal Wash (SINUS RINSE BOTTLE KIT) PACK    Sig: Place 1 each into the nose daily.    Dispense:  1 each    Refill:  1   - Education materials provided to the patient. - Follow up: PRN. Patient instructed to return sooner or go to the ED if new/worsening symptoms develop.   Thank you for allowing me the opportunity to care for your patient. Please do not hesitate to contact me should you have any other questions.  Sincerely, Penne Croak, DO Otolaryngologist (ENT) [facility] Phone: [phone] Fax: [phone]  07/07/2024, 11:29 AM        [1]  Current Outpatient Medications:    cephALEXin  (KEFLEX ) 500 MG capsule, Take 1 capsule (500 mg total) by mouth 2 (two) times daily for 14 days., Disp: 28 capsule, Rfl: 0   Cholecalciferol  (VITAMIN D ) 50 MCG (2000 UT) tablet, Take 1 tablet (2,000 Units total) by mouth daily., Disp: 30 tablet, Rfl: 2   Continuous Glucose Receiver (DEXCOM G6 RECEIVER) DEVI, Use as directed with sensors, Disp: 1 each, Rfl: PRN   Continuous Glucose Sensor (DEXCOM G6 SENSOR) MISC, Change sensor every 10 days., Disp: 3 each, Rfl: 3   Continuous Glucose Transmitter (DEXCOM G6 TRANSMITTER) MISC, Use with sensors, Disp: 1 each, Rfl: PRN   glipiZIDE  (GLUCOTROL ) 5 MG tablet, Take 1 tablet (5 mg total) by mouth daily before breakfast., Disp: 30 tablet, Rfl: 2   Hypertonic Nasal Wash (SINUS RINSE BOTTLE KIT) PACK, Place 1 each into the nose daily., Disp: 1 each, Rfl: 1   ibuprofen  (ADVIL ) 600 MG tablet, Take 1 tablet (600 mg total) by mouth every 6 (six) hours as needed., Disp: 20 tablet, Rfl: 0   lisinopril  (ZESTRIL ) 2.5 MG tablet, Take 1 tablet (2.5 mg total) by mouth daily., Disp: 30 tablet, Rfl: 2   sitaGLIPtin   (JANUVIA ) 25 MG tablet, Take 1 tablet (25 mg total) by mouth daily., Disp: 30 tablet, Rfl: 2

## 2024-07-08 ENCOUNTER — Other Ambulatory Visit: Payer: Self-pay

## 2024-07-21 ENCOUNTER — Ambulatory Visit: Admitting: Family Medicine
